# Patient Record
Sex: Female | Born: 1945 | Race: White | Hispanic: No | Marital: Married | Smoking: Former smoker
Health system: Southern US, Community
[De-identification: ages and names within clinical notes are randomized; demographics above are authoritative.]

## PROBLEM LIST (undated history)

## (undated) DIAGNOSIS — M779 Enthesopathy, unspecified: Secondary | ICD-10-CM

## (undated) DIAGNOSIS — R609 Edema, unspecified: Secondary | ICD-10-CM

## (undated) DIAGNOSIS — C439 Malignant melanoma of skin, unspecified: Secondary | ICD-10-CM

## (undated) DIAGNOSIS — R55 Syncope and collapse: Secondary | ICD-10-CM

## (undated) DIAGNOSIS — E119 Type 2 diabetes mellitus without complications: Secondary | ICD-10-CM

## (undated) DIAGNOSIS — D3610 Benign neoplasm of peripheral nerves and autonomic nervous system, unspecified: Secondary | ICD-10-CM

## (undated) HISTORY — DX: Enthesopathy, unspecified: M77.9

## (undated) HISTORY — DX: Benign neoplasm of peripheral nerves and autonomic nervous system, unspecified: D36.10

## (undated) HISTORY — DX: Type 2 diabetes mellitus without complications: E11.9

## (undated) HISTORY — DX: Malignant melanoma of skin, unspecified: C43.9

## (undated) HISTORY — DX: Edema, unspecified: R60.9

## (undated) HISTORY — DX: Syncope and collapse: R55

---
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
## 1970-05-14 HISTORY — PX: CHOLECYSTECTOMY: SHX55

## 1978-05-14 HISTORY — PX: BREAST SURGERY: SHX581

## 1989-05-14 HISTORY — PX: ABDOMINAL HYSTERECTOMY: SHX81

## 1990-05-14 DIAGNOSIS — R55 Syncope and collapse: Secondary | ICD-10-CM

## 1990-05-14 HISTORY — DX: Syncope and collapse: R55

## 1995-05-15 HISTORY — PX: EXCISION/RELEASE BURSA HIP: SHX5014

## 1998-05-14 DIAGNOSIS — R609 Edema, unspecified: Secondary | ICD-10-CM

## 1998-05-14 DIAGNOSIS — M779 Enthesopathy, unspecified: Secondary | ICD-10-CM

## 1998-05-14 HISTORY — DX: Edema, unspecified: R60.9

## 1998-05-14 HISTORY — DX: Enthesopathy, unspecified: M77.9

## 2003-05-15 DIAGNOSIS — E119 Type 2 diabetes mellitus without complications: Secondary | ICD-10-CM

## 2003-05-15 HISTORY — DX: Type 2 diabetes mellitus without complications: E11.9

## 2003-05-15 HISTORY — PX: LIVER BIOPSY: SHX301

## 2004-05-14 DIAGNOSIS — D3610 Benign neoplasm of peripheral nerves and autonomic nervous system, unspecified: Secondary | ICD-10-CM

## 2004-05-14 HISTORY — DX: Benign neoplasm of peripheral nerves and autonomic nervous system, unspecified: D36.10

## 2004-09-14 ENCOUNTER — Ambulatory Visit: Payer: Self-pay

## 2005-04-23 ENCOUNTER — Ambulatory Visit: Payer: Self-pay | Admitting: Internal Medicine

## 2006-04-11 ENCOUNTER — Ambulatory Visit: Payer: Self-pay | Admitting: Otolaryngology

## 2006-05-14 DIAGNOSIS — C439 Malignant melanoma of skin, unspecified: Secondary | ICD-10-CM

## 2006-05-14 HISTORY — DX: Malignant melanoma of skin, unspecified: C43.9

## 2006-05-14 HISTORY — PX: MELANOMA EXCISION: SHX5266

## 2007-02-25 ENCOUNTER — Ambulatory Visit: Payer: Self-pay | Admitting: Dermatology

## 2007-04-24 ENCOUNTER — Encounter: Payer: Self-pay | Admitting: Specialist

## 2009-11-02 ENCOUNTER — Ambulatory Visit: Payer: Self-pay | Admitting: Anesthesiology

## 2009-11-15 ENCOUNTER — Ambulatory Visit: Payer: Self-pay | Admitting: Anesthesiology

## 2009-12-21 ENCOUNTER — Ambulatory Visit: Payer: Self-pay | Admitting: Anesthesiology

## 2010-01-19 ENCOUNTER — Ambulatory Visit: Payer: Self-pay | Admitting: Anesthesiology

## 2010-09-15 ENCOUNTER — Emergency Department: Payer: Self-pay | Admitting: Emergency Medicine

## 2010-12-22 ENCOUNTER — Ambulatory Visit: Payer: Self-pay | Admitting: Unknown Physician Specialty

## 2011-01-30 ENCOUNTER — Ambulatory Visit: Payer: Self-pay | Admitting: Unknown Physician Specialty

## 2011-01-31 ENCOUNTER — Ambulatory Visit: Payer: Self-pay | Admitting: Unknown Physician Specialty

## 2011-05-15 HISTORY — PX: KNEE SURGERY: SHX244

## 2017-12-26 ENCOUNTER — Other Ambulatory Visit: Payer: Self-pay | Admitting: Unknown Physician Specialty

## 2017-12-26 DIAGNOSIS — G8929 Other chronic pain: Secondary | ICD-10-CM

## 2017-12-26 DIAGNOSIS — M25561 Pain in right knee: Principal | ICD-10-CM

## 2018-01-08 ENCOUNTER — Ambulatory Visit
Admission: RE | Admit: 2018-01-08 | Discharge: 2018-01-08 | Disposition: A | Discharge status: Home / Self Care | Payer: Federal, State, Local not specified - PPO | Source: Ambulatory Visit | Attending: Unknown Physician Specialty | Admitting: Unknown Physician Specialty

## 2018-01-08 DIAGNOSIS — G8929 Other chronic pain: Secondary | ICD-10-CM | POA: Diagnosis present

## 2018-01-08 DIAGNOSIS — M1711 Unilateral primary osteoarthritis, right knee: Secondary | ICD-10-CM | POA: Diagnosis not present

## 2018-01-08 DIAGNOSIS — M25561 Pain in right knee: Secondary | ICD-10-CM

## 2018-01-08 DIAGNOSIS — S83241A Other tear of medial meniscus, current injury, right knee, initial encounter: Secondary | ICD-10-CM | POA: Diagnosis not present

## 2018-01-16 DIAGNOSIS — M1711 Unilateral primary osteoarthritis, right knee: Secondary | ICD-10-CM | POA: Insufficient documentation

## 2018-04-03 ENCOUNTER — Encounter: Payer: Self-pay | Admitting: Student in an Organized Health Care Education/Training Program

## 2018-04-03 ENCOUNTER — Ambulatory Visit
Payer: Federal, State, Local not specified - PPO | Attending: Student in an Organized Health Care Education/Training Program | Admitting: Student in an Organized Health Care Education/Training Program

## 2018-04-03 ENCOUNTER — Other Ambulatory Visit: Payer: Self-pay

## 2018-04-03 VITALS — BP 153/71 | HR 92 | Temp 98.7°F | Resp 18 | Ht 63.75 in | Wt 208.0 lb

## 2018-04-03 DIAGNOSIS — Z85820 Personal history of malignant melanoma of skin: Secondary | ICD-10-CM | POA: Diagnosis not present

## 2018-04-03 DIAGNOSIS — M25561 Pain in right knee: Secondary | ICD-10-CM | POA: Diagnosis not present

## 2018-04-03 DIAGNOSIS — E119 Type 2 diabetes mellitus without complications: Secondary | ICD-10-CM | POA: Diagnosis not present

## 2018-04-03 DIAGNOSIS — G8929 Other chronic pain: Secondary | ICD-10-CM | POA: Diagnosis present

## 2018-04-03 DIAGNOSIS — Z87891 Personal history of nicotine dependence: Secondary | ICD-10-CM | POA: Diagnosis not present

## 2018-04-03 DIAGNOSIS — Z79899 Other long term (current) drug therapy: Secondary | ICD-10-CM | POA: Insufficient documentation

## 2018-04-03 DIAGNOSIS — Z7982 Long term (current) use of aspirin: Secondary | ICD-10-CM | POA: Diagnosis not present

## 2018-04-03 DIAGNOSIS — M1711 Unilateral primary osteoarthritis, right knee: Secondary | ICD-10-CM | POA: Diagnosis not present

## 2018-04-03 DIAGNOSIS — E669 Obesity, unspecified: Secondary | ICD-10-CM | POA: Insufficient documentation

## 2018-04-03 DIAGNOSIS — Z6835 Body mass index (BMI) 35.0-35.9, adult: Secondary | ICD-10-CM | POA: Insufficient documentation

## 2018-04-03 DIAGNOSIS — Z7984 Long term (current) use of oral hypoglycemic drugs: Secondary | ICD-10-CM | POA: Insufficient documentation

## 2018-04-03 DIAGNOSIS — G894 Chronic pain syndrome: Secondary | ICD-10-CM | POA: Insufficient documentation

## 2018-04-03 DIAGNOSIS — M25562 Pain in left knee: Secondary | ICD-10-CM

## 2018-04-03 MED ORDER — DICLOFENAC SODIUM 75 MG PO TBEC: 75.0000 mg | DELAYED_RELEASE_TABLET | Freq: Every day | ORAL | 1 refills | Status: DC

## 2018-04-03 NOTE — Patient Instructions (Addendum)
Stop Aleve while on Diclofenac  Diclofenac has been escribed to your pharmacy.    GENERAL RISKS AND COMPLICATIONS  What are the risk, side effects and possible complications? Generally speaking, most procedures are safe.  However, with any procedure there are risks, side effects, and the possibility of complications.  The risks and complications are dependent upon the sites that are lesioned, or the type of nerve block to be performed.  The closer the procedure is to the spine, the more serious the risks are.  Great care is taken when placing the radio frequency needles, block needles or lesioning probes, but sometimes complications can occur. 1. Infection: Any time there is an injection through the skin, there is a risk of infection.  This is why sterile conditions are used for these blocks.  There are four possible types of infection. 1. Localized skin infection. 2. Central Nervous System Infection-This can be in the form of Meningitis, which can be deadly. 3. Epidural Infections-This can be in the form of an epidural abscess, which can cause pressure inside of the spine, causing compression of the spinal cord with subsequent paralysis. This would require an emergency surgery to decompress, and there are no guarantees that the patient would recover from the paralysis. 4. Discitis-This is an infection of the intervertebral discs.  It occurs in about 1% of discography procedures.  It is difficult to treat and it may lead to surgery.        2. Pain: the needles have to go through skin and soft tissues, will cause soreness.       3. Damage to internal structures:  The nerves to be lesioned may be near blood vessels or    other nerves which can be potentially damaged.       4. Bleeding: Bleeding is more common if the patient is taking blood thinners such as  aspirin, Coumadin, Ticiid, Plavix, etc., or if he/she have some genetic predisposition  such as hemophilia. Bleeding into the spinal canal can  cause compression of the spinal  cord with subsequent paralysis.  This would require an emergency surgery to  decompress and there are no guarantees that the patient would recover from the  paralysis.       5. Pneumothorax:  Puncturing of a lung is a possibility, every time a needle is introduced in  the area of the chest or upper back.  Pneumothorax refers to free air around the  collapsed lung(s), inside of the thoracic cavity (chest cavity).  Another two possible  complications related to a similar event would include: Hemothorax and Chylothorax.   These are variations of the Pneumothorax, where instead of air around the collapsed  lung(s), you may have blood or chyle, respectively.       6. Spinal headaches: They may occur with any procedures in the area of the spine.       7. Persistent CSF (Cerebro-Spinal Fluid) leakage: This is a rare problem, but may occur  with prolonged intrathecal or epidural catheters either due to the formation of a fistulous  track or a dural tear.       8. Nerve damage: By working so close to the spinal cord, there is always a possibility of  nerve damage, which could be as serious as a permanent spinal cord injury with  paralysis.       9. Death:  Although rare, severe deadly allergic reactions known as "Anaphylactic  reaction" can occur to any of the medications used.  10. Worsening of the symptoms:  We can always make thing worse.  What are the chances of something like this happening? Chances of any of this occuring are extremely low.  By statistics, you have more of a chance of getting killed in a motor vehicle accident: while driving to the hospital than any of the above occurring .  Nevertheless, you should be aware that they are possibilities.  In general, it is similar to taking a shower.  Everybody knows that you can slip, hit your head and get killed.  Does that mean that you should not shower again?  Nevertheless always keep in mind that statistics do not mean  anything if you happen to be on the wrong side of them.  Even if a procedure has a 1 (one) in a 1,000,000 (million) chance of going wrong, it you happen to be that one..Also, keep in mind that by statistics, you have more of a chance of having something go wrong when taking medications.  Who should not have this procedure? If you are on a blood thinning medication (e.g. Coumadin, Plavix, see list of "Blood Thinners"), or if you have an active infection going on, you should not have the procedure.  If you are taking any blood thinners, please inform your physician.  How should I prepare for this procedure?  Do not eat or drink anything at least six hours prior to the procedure.  Bring a driver with you .  It cannot be a taxi.  Come accompanied by an adult that can drive you back, and that is strong enough to help you if your legs get weak or numb from the local anesthetic.  Take all of your medicines the morning of the procedure with just enough water to swallow them.  If you have diabetes, make sure that you are scheduled to have your procedure done first thing in the morning, whenever possible.  If you have diabetes, take only half of your insulin dose and notify our nurse that you have done so as soon as you arrive at the clinic.  If you are diabetic, but only take blood sugar pills (oral hypoglycemic), then do not take them on the morning of your procedure.  You may take them after you have had the procedure.  Do not take aspirin or any aspirin-containing medications, at least eleven (11) days prior to the procedure.  They may prolong bleeding.  Wear loose fitting clothing that may be easy to take off and that you would not mind if it got stained with Betadine or blood.  Do not wear any jewelry or perfume  Remove any nail coloring.  It will interfere with some of our monitoring equipment.  NOTE: Remember that this is not meant to be interpreted as a complete list of all possible  complications.  Unforeseen problems may occur.  BLOOD THINNERS The following drugs contain aspirin or other products, which can cause increased bleeding during surgery and should not be taken for 2 weeks prior to and 1 week after surgery.  If you should need take something for relief of minor pain, you may take acetaminophen which is found in Tylenol,m Datril, Anacin-3 and Panadol. It is not blood thinner. The products listed below are.  Do not take any of the products listed below in addition to any listed on your instruction sheet.  A.P.C or A.P.C with Codeine Codeine Phosphate Capsules #3 Ibuprofen Ridaura  ABC compound Congesprin Imuran rimadil  Advil Cope Indocin Robaxisal  Alka-Seltzer Effervescent Pain Reliever and Antacid Coricidin or Coricidin-D  Indomethacin Rufen  Alka-Seltzer plus Cold Medicine Cosprin Ketoprofen S-A-C Tablets  Anacin Analgesic Tablets or Capsules Coumadin Korlgesic Salflex  Anacin Extra Strength Analgesic tablets or capsules CP-2 Tablets Lanoril Salicylate  Anaprox Cuprimine Capsules Levenox Salocol  Anexsia-D Dalteparin Magan Salsalate  Anodynos Darvon compound Magnesium Salicylate Sine-off  Ansaid Dasin Capsules Magsal Sodium Salicylate  Anturane Depen Capsules Marnal Soma  APF Arthritis pain formula Dewitt's Pills Measurin Stanback  Argesic Dia-Gesic Meclofenamic Sulfinpyrazone  Arthritis Bayer Timed Release Aspirin Diclofenac Meclomen Sulindac  Arthritis pain formula Anacin Dicumarol Medipren Supac  Analgesic (Safety coated) Arthralgen Diffunasal Mefanamic Suprofen  Arthritis Strength Bufferin Dihydrocodeine Mepro Compound Suprol  Arthropan liquid Dopirydamole Methcarbomol with Aspirin Synalgos  ASA tablets/Enseals Disalcid Micrainin Tagament  Ascriptin Doan's Midol Talwin  Ascriptin A/D Dolene Mobidin Tanderil  Ascriptin Extra Strength Dolobid Moblgesic Ticlid  Ascriptin with Codeine Doloprin or Doloprin with Codeine Momentum Tolectin  Asperbuf Duoprin  Mono-gesic Trendar  Aspergum Duradyne Motrin or Motrin IB Triminicin  Aspirin plain, buffered or enteric coated Durasal Myochrisine Trigesic  Aspirin Suppositories Easprin Nalfon Trillsate  Aspirin with Codeine Ecotrin Regular or Extra Strength Naprosyn Uracel  Atromid-S Efficin Naproxen Ursinus  Auranofin Capsules Elmiron Neocylate Vanquish  Axotal Emagrin Norgesic Verin  Azathioprine Empirin or Empirin with Codeine Normiflo Vitamin E  Azolid Emprazil Nuprin Voltaren  Bayer Aspirin plain, buffered or children's or timed BC Tablets or powders Encaprin Orgaran Warfarin Sodium  Buff-a-Comp Enoxaparin Orudis Zorpin  Buff-a-Comp with Codeine Equegesic Os-Cal-Gesic   Buffaprin Excedrin plain, buffered or Extra Strength Oxalid   Bufferin Arthritis Strength Feldene Oxphenbutazone   Bufferin plain or Extra Strength Feldene Capsules Oxycodone with Aspirin   Bufferin with Codeine Fenoprofen Fenoprofen Pabalate or Pabalate-SF   Buffets II Flogesic Panagesic   Buffinol plain or Extra Strength Florinal or Florinal with Codeine Panwarfarin   Buf-Tabs Flurbiprofen Penicillamine   Butalbital Compound Four-way cold tablets Penicillin   Butazolidin Fragmin Pepto-Bismol   Carbenicillin Geminisyn Percodan   Carna Arthritis Reliever Geopen Persantine   Carprofen Gold's salt Persistin   Chloramphenicol Goody's Phenylbutazone   Chloromycetin Haltrain Piroxlcam   Clmetidine heparin Plaquenil   Cllnoril Hyco-pap Ponstel   Clofibrate Hydroxy chloroquine Propoxyphen         Before stopping any of these medications, be sure to consult the physician who ordered them.  Some, such as Coumadin (Warfarin) are ordered to prevent or treat serious conditions such as "deep thrombosis", "pumonary embolisms", and other heart problems.  The amount of time that you may need off of the medication may also vary with the medication and the reason for which you were taking it.  If you are taking any of these medications, please  make sure you notify your pain physician before you undergo any procedures.

## 2018-04-03 NOTE — Progress Notes (Signed)
Patient's Name: Meredith Caldwell  MRN: [id number]  Referring Provider: Leanor Kail, MD  DOB: 1945/05/19  PCP: Baxter Hire, MD  DOS: 04/03/2018  Note by: Gillis Santa, MD  Service setting: Ambulatory outpatient  Specialty: Interventional Pain Management  Location: [facility] (AMB) Pain Management Facility  Visit type: Initial Patient Evaluation  Patient type: New Patient   Primary Reason(s) for Visit: Encounter for initial evaluation of one or more chronic problems (new to examiner) potentially causing chronic pain, and posing a threat to normal musculoskeletal function. (Level of risk: High) CC: Knee Pain (bilaterally, right is worse)  HPI  Meredith Caldwell is a 72 y.o. year old, female patient, who comes today to see [place] for the first time for an initial evaluation of her chronic pain. She has Primary osteoarthritis of right knee; Obesity; Diabetes mellitus type 2, uncomplicated ([place]); Chronic pain of right knee; and Chronic pain syndrome on their problem list. Today she comes in for evaluation of her Knee Pain (bilaterally, right is worse)  Pain Assessment: Location: Right, Left(right is worse) Knee Radiating: behind knees and down sides of leg to calves; occasionally right big toe feels like a "charlie horse" Onset: More than a month ago Duration: Chronic pain Quality: Constant, Sharp Severity: 8 /10 (subjective, self-reported pain score)  Note: Reported level is inconsistent with clinical observations.                         When using our objective Pain Scale, levels between 6 and 10/10 are said to belong in an emergency room, as it progressively worsens from a 6/10, described as severely limiting, requiring emergency care not usually available at an outpatient pain management facility. At a 6/10 level, communication becomes difficult and requires great effort. Assistance to reach the emergency department may be required. Facial flushing and profuse sweating along with potentially dangerous  increases in heart rate and blood pressure will be evident. Effect on ADL: cannot kneel or squat, cannot carry laundry basket Timing: Constant Modifying factors: injection BP: (!) 153/71(PUT CUFF ON LEFT ARM)  HR: 92  Onset and Duration: Sudden, Date of onset: July 2019 and Date of injury: fall Cause of pain: fall Severity: NAS-11 at its worse: 9/10, NAS-11 at its best: 5/10, NAS-11 now: 6/10 and NAS-11 on the average: 6/10 Timing: Night and After activity or exercise Aggravating Factors: Bending, Climbing, Kneeling, Lifiting, Motion, Prolonged sitting, Prolonged standing, Squatting, Stooping , Twisting, Walking, Walking uphill and Walking downhill Alleviating Factors: Hot packs, Resting, Sitting and Warm showers or baths Associated Problems: Weakness and Pain that wakes patient up Quality of Pain: Disabling, Sharp, Stabbing and Tender Previous Examinations or Tests: MRI scan Previous Treatments: The patient denies shot in the back  The patient comes into the clinics today for the first time for a chronic pain management evaluation.   72 year old female with a history of right knee osteoarthritis, tricompartmental, status post right knee arthroscopy in 2012 for a torn medial meniscus as well as significant chondral degeneration.  She is status post right knee Visco supplementation which was not very effective.  Patient is also tried right knee corticosteroid injections which only lasted a short period of time.  Patient presents as a referral from Dr. Jefm Bryant for evaluation of right knee genicular nerve block and possible radiofrequency ablation.   Meds   Current Outpatient Medications:  .  albuterol (PROVENTIL HFA;VENTOLIN HFA) 108 (90 Base) MCG/ACT inhaler, Inhale into the lungs., Disp: , Rfl:  .  aspirin EC 81 MG tablet, Take by mouth., Disp: , Rfl:  .  cetirizine (ZYRTEC) 10 MG tablet, Take 10 mg by mouth daily., Disp: , Rfl:  .  colesevelam (WELCHOL) 625 MG tablet, Take by mouth.,  Disp: , Rfl:  .  FISH OIL-KRILL OIL PO, Take 360 mg by mouth., Disp: , Rfl:  .  furosemide (LASIX) 40 MG tablet, Take by mouth., Disp: , Rfl:  .  losartan (COZAAR) 50 MG tablet, take 1 tablet by mouth once daily, Disp: , Rfl:  .  metFORMIN (GLUCOPHAGE) 1000 MG tablet, Take 1,000 mg by mouth 2 (two) times daily with a meal., Disp: , Rfl:  .  montelukast (SINGULAIR) 10 MG tablet, Take 10 mg by mouth at bedtime., Disp: , Rfl:  .  Multiple Vitamin (MULTIVITAMIN WITH MINERALS) TABS tablet, Take 1 tablet by mouth daily., Disp: , Rfl:  .  nabumetone (RELAFEN) 500 MG tablet, Take 500 mg by mouth daily., Disp: , Rfl:  .  naproxen sodium (ALEVE) 220 MG tablet, Take 220 mg by mouth., Disp: , Rfl:  .  PARoxetine (PAXIL-CR) 25 MG 24 hr tablet, Take 25 mg by mouth daily., Disp: , Rfl:  .  penciclovir (DENAVIR) 1 % cream, Apply 1 application topically every 2 (two) hours., Disp: , Rfl:  .  zolpidem (AMBIEN) 5 MG tablet, Take 5 mg by mouth at bedtime as needed for sleep., Disp: , Rfl:  .  diclofenac (VOLTAREN) 75 MG EC tablet, Take 1 tablet (75 mg total) by mouth daily for 60 doses. (Patient not taking: Reported on 04/03/2018), Disp: 30 tablet, Rfl: 1   Knee-R MR wo contrast:  Results for orders placed during the hospital encounter of 01/08/18  MR KNEE RIGHT WO CONTRAST   Narrative CLINICAL DATA:  Diffuse right knee pain. History of fall in July, 2019. The patient underwent right knee surgery in 2013.  EXAM: MRI OF THE RIGHT KNEE WITHOUT CONTRAST  TECHNIQUE: Multiplanar, multisequence MR imaging of the knee was performed. No intravenous contrast was administered.  COMPARISON:  MRI right knee 12/22/2010.  FINDINGS: MENISCI  Medial meniscus: Radial tear at the root of the posterior horn of the medial meniscus is again seen. The remainder of the posterior horn is diminutive with blunting along its free edge likely related to prior surgery. The body is severely degenerated and extruded peripherally  with a horizontal tear reaching the meniscal undersurface.  Lateral meniscus:  Intact.  LIGAMENTS  Cruciates:  Intact.  Collaterals:  Intact.  CARTILAGE  Patellofemoral: Diffuse, severe cartilage loss is again seen and appears somewhat worse than on the prior exam.  Medial:  Diffuse and severe cartilage loss is again seen.  Lateral:  No marked change in thinning and irregularity throughout.  Joint:  Small effusion.  Popliteal Fossa:  No Baker's cyst.  Extensor Mechanism:  Intact.  Bones: Tricompartmental osteophytosis has worsened since the prior examination, particularly on the medial side. No fracture or worrisome lesion.  Other: None.  IMPRESSION: Dominant finding is advanced osteoarthritis about the knee appearing worst in the medial and patellofemoral compartments.  Radial tear at the root of the posterior of the medial meniscus is unchanged in appearance since the prior exam. The remainder of the posterior horn appears to have been debrided. Severely degenerated and diminutive body of the medial meniscus is extruded peripherally.   Electronically Signed   By: Inge Rise M.D.   On: 01/09/2018 11:29      Complexity Note: Imaging results reviewed. Results shared  with Ms. Vary, using Layman's terms.                         ROS  Cardiovascular: Daily Aspirin intake and High blood pressure Pulmonary or Respiratory: Wheezing and difficulty taking a deep full breath (Asthma) Neurological: No reported neurological signs or symptoms such as seizures, abnormal skin sensations, urinary and/or fecal incontinence, being born with an abnormal open spine and/or a tethered spinal cord Review of Past Neurological Studies: No results found for this or any previous visit. Psychological-Psychiatric: Anxiousness and Difficulty sleeping and or falling asleep Gastrointestinal: No reported gastrointestinal signs or symptoms such as vomiting or evacuating blood,  reflux, heartburn, alternating episodes of diarrhea and constipation, inflamed or scarred liver, or pancreas or irrregular and/or infrequent bowel movements Genitourinary: No reported renal or genitourinary signs or symptoms such as difficulty voiding or producing urine, peeing blood, non-functioning kidney, kidney stones, difficulty emptying the bladder, difficulty controlling the flow of urine, or chronic kidney disease Hematological: No reported hematological signs or symptoms such as prolonged bleeding, low or poor functioning platelets, bruising or bleeding easily, hereditary bleeding problems, low energy levels due to low hemoglobin or being anemic Endocrine: High blood sugar controlled without the use of insulin (NIDDM) Rheumatologic: No reported rheumatological signs and symptoms such as fatigue, joint pain, tenderness, swelling, redness, heat, stiffness, decreased range of motion, with or without associated rash Musculoskeletal: Negative for myasthenia gravis, muscular dystrophy, multiple sclerosis or malignant hyperthermia Work History: Retired  Allergies  Ms. Blane is allergic to statins; theophyllines; and zetia [ezetimibe].  Laboratory Chemistry  Inflammation Markers (CRP: Acute Phase) (ESR: Chronic Phase) No results found for: CRP, ESRSEDRATE, LATICACIDVEN                       Rheumatology Markers No results found for: RF, ANA, LABURIC, URICUR, LYMEIGGIGMAB, LYMEABIGMQN, HLAB27                      Renal Function Markers No results found for: BUN, CREATININE, BCR, GFRAA, GFRNONAA                           Hepatic Function Markers No results found for: AST, ALT, ALBUMIN, ALKPHOS, HCVAB, AMYLASE, LIPASE, AMMONIA                      Electrolytes No results found for: NA, K, CL, CALCIUM, MG, PHOS                      Neuropathy Markers No results found for: VITAMINB12, FOLATE, HGBA1C, HIV                      CNS Tests No results found for: COLORCSF, APPEARCSF,  RBCCOUNTCSF, WBCCSF, POLYSCSF, LYMPHSCSF, EOSCSF, PROTEINCSF, GLUCCSF, JCVIRUS, CSFOLI, IGGCSF                      Bone Pathology Markers No results found for: VD25OH, [id number], HE5277OE4, MP5361WE3, 25OHVITD1, 25OHVITD2, 25OHVITD3, TESTOFREE, TESTOSTERONE                       Coagulation Parameters No results found for: INR, LABPROT, APTT, PLT, DDIMER, LABHEMA                      Cardiovascular Markers No results found  for: BNP, CKTOTAL, CKMB, TROPONINI, HGB, HCT                       CA Markers No results found for: CEA, CA125, LABCA2                      Note: Lab results reviewed.  [place]  Drug: Ms. Dor  reports that she does not use drugs. Alcohol:  reports that she drinks alcohol. Tobacco:  reports that she quit smoking about 49 years ago. She has never used smokeless tobacco. Medical:  has a past medical history of Bone spur (2000), Diabetes ([place]) (2005), Edema (2000), Melanoma ([place]) (2008), Neuroma (2006), and Vaso vagal episode (1992). Family: family history is not on file.  Past Surgical History:  Procedure Laterality Date  . ABDOMINAL HYSTERECTOMY  1991  . APPENDECTOMY  1960  . BREAST SURGERY  1980   right chest, left brest  . CHOLECYSTECTOMY  1972  . EXCISION/RELEASE BURSA HIP Left 1997  . KNEE SURGERY Right 2013  . LIVER BIOPSY  2005  . MELANOMA EXCISION Left 2008   arm; cancerous  . TONSILLECTOMY AND ADENOIDECTOMY  1950   Active Ambulatory Problems    Diagnosis Date Noted  . Primary osteoarthritis of right knee 01/16/2018  . Obesity 04/03/2018  . Diabetes mellitus type 2, uncomplicated ([place]) 24/23/5361  . Chronic pain of right knee 04/03/2018  . Chronic pain syndrome 04/03/2018   Resolved Ambulatory Problems    Diagnosis Date Noted  . No Resolved Ambulatory Problems   Past Medical History:  Diagnosis Date  . Bone spur 2000  . Diabetes ([place]) 2005  . Edema 2000  . Melanoma ([place]) 2008  . Neuroma 2006  . Vaso vagal episode 1992    Constitutional Exam  General appearance: alert, cooperative and moderately obese Vitals:   04/03/18 1053  BP: (!) 153/71  Pulse: 92  Resp: 18  Temp: 98.7 F (37.1 C)  TempSrc: Oral  SpO2: 95%  Weight: 208 lb (94.3 kg)  Height: 5' 3.75" (1.619 m)   BMI Assessment: Estimated body mass index is 35.98 kg/m as calculated from the following:   Height as of this encounter: 5' 3.75" (1.619 m).   Weight as of this encounter: 208 lb (94.3 kg).  BMI interpretation table: BMI level Category Range association with higher incidence of chronic pain  <18 kg/m2 Underweight   18.5-24.9 kg/m2 Ideal body weight   25-29.9 kg/m2 Overweight Increased incidence by 20%  30-34.9 kg/m2 Obese (Class I) Increased incidence by 68%  35-39.9 kg/m2 Severe obesity (Class II) Increased incidence by 136%  >40 kg/m2 Extreme obesity (Class III) Increased incidence by 254%   Patient's current BMI Ideal Body weight  Body mass index is 35.98 kg/m. Ideal body weight: 54.1 kg (119 lb 5.2 oz) Adjusted ideal body weight: 70.2 kg (154 lb 12.7 oz)   BMI Readings from Last 4 Encounters:  04/03/18 35.98 kg/m   Wt Readings from Last 4 Encounters:  04/03/18 208 lb (94.3 kg)  Psych/Mental status: Alert, oriented x 3 (person, place, & time)       Eyes: PERLA Respiratory: No evidence of acute respiratory distress  Cervical Spine Area Exam  Skin & Axial Inspection: No masses, redness, edema, swelling, or associated skin lesions Alignment: Symmetrical Functional ROM: Unrestricted ROM      Stability: No instability detected Muscle Tone/Strength: Functionally intact. No obvious neuro-muscular anomalies detected. Sensory (Neurological): Unimpaired Palpation: No palpable anomalies  Upper Extremity (UE) Exam    Side: Right upper extremity  Side: Left upper extremity  Skin & Extremity Inspection: Skin color, temperature, and hair growth are WNL. No peripheral edema or cyanosis. No masses, redness, swelling,  asymmetry, or associated skin lesions. No contractures.  Skin & Extremity Inspection: Skin color, temperature, and hair growth are WNL. No peripheral edema or cyanosis. No masses, redness, swelling, asymmetry, or associated skin lesions. No contractures.  Functional ROM: Unrestricted ROM          Functional ROM: Unrestricted ROM          Muscle Tone/Strength: Functionally intact. No obvious neuro-muscular anomalies detected.  Muscle Tone/Strength: Functionally intact. No obvious neuro-muscular anomalies detected.  Sensory (Neurological): Unimpaired          Sensory (Neurological): Unimpaired          Palpation: No palpable anomalies              Palpation: No palpable anomalies              Provocative Test(s):  Phalen's test: deferred Tinel's test: deferred Apley's scratch test (touch opposite shoulder):  Action 1 (Across chest): deferred Action 2 (Overhead): deferred Action 3 (LB reach): deferred   Provocative Test(s):  Phalen's test: deferred Tinel's test: deferred Apley's scratch test (touch opposite shoulder):  Action 1 (Across chest): deferred Action 2 (Overhead): deferred Action 3 (LB reach): deferred    Thoracic Spine Area Exam  Skin & Axial Inspection: No masses, redness, or swelling Alignment: Symmetrical Functional ROM: Unrestricted ROM Stability: No instability detected Muscle Tone/Strength: Functionally intact. No obvious neuro-muscular anomalies detected. Sensory (Neurological): Unimpaired Muscle strength & Tone: No palpable anomalies  Lumbar Spine Area Exam  Skin & Axial Inspection: No masses, redness, or swelling Alignment: Symmetrical Functional ROM: Unrestricted ROM       Stability: No instability detected Muscle Tone/Strength: Functionally intact. No obvious neuro-muscular anomalies detected. Sensory (Neurological): Unimpaired Palpation: No palpable anomalies       Provocative Tests: Hyperextension/rotation test: deferred today       Lumbar quadrant test  (Kemp's test): deferred today       Lateral bending test: deferred today       Patrick's Maneuver: deferred today                   FABER test: deferred today                   S-I anterior distraction/compression test: deferred today         S-I lateral compression test: deferred today         S-I Thigh-thrust test: deferred today         S-I Gaenslen's test: deferred today          Gait & Posture Assessment  Ambulation: Unassisted Gait: Relatively normal for age and body habitus Posture: WNL   Lower Extremity Exam    Side: Right lower extremity  Side: Left lower extremity  Stability: No instability observed          Stability: No instability observed          Skin & Extremity Inspection: Evidence of prior arthroplastic surgery  Skin & Extremity Inspection: Skin color, temperature, and hair growth are WNL. No peripheral edema or cyanosis. No masses, redness, swelling, asymmetry, or associated skin lesions. No contractures.  Functional ROM: Decreased ROM for hip and knee joints          Functional ROM: Unrestricted ROM  Muscle Tone/Strength: Functionally intact. No obvious neuro-muscular anomalies detected.  Muscle Tone/Strength: Functionally intact. No obvious neuro-muscular anomalies detected.  Sensory (Neurological): Arthropathic arthralgia        Sensory (Neurological): Unimpaired        DTR: Patellar: deferred today Achilles: deferred today Plantar: deferred today  DTR: Patellar: deferred today Achilles: deferred today Plantar: deferred today  Palpation: Complains of area being tender to palpation  Palpation: No palpable anomalies   Assessment  Primary Diagnosis & Pertinent Problem List: The primary encounter diagnosis was Primary osteoarthritis of right knee. Diagnoses of Chronic pain of right knee, Obesity, unspecified classification, unspecified obesity type, unspecified whether serious comorbidity present, and Chronic pain syndrome were also pertinent to  this visit.  Visit Diagnosis (New problems to examiner): 1. Primary osteoarthritis of right knee   2. Chronic pain of right knee   3. Obesity, unspecified classification, unspecified obesity type, unspecified whether serious comorbidity present   4. Chronic pain syndrome    72 year old female with a history of right knee osteoarthritis, tricompartmental, status post right knee arthroscopy in 2012 for a torn medial meniscus as well as significant chondral degeneration.  She is status post right knee Visco supplementation which was not very effective.  Patient is also tried right knee corticosteroid injections which only lasted a short period of time.  Patient has also tried PT, heat, creams, NSAIDs without any significant benefit. Patient presents as a referral from Dr. Jefm Bryant for evaluation of right knee genicular nerve block and possible radiofrequency ablation.  Risks and benefits of right knee genicular nerve block were discussed and patient would like to proceed.  Patient has not tried diclofenac.  She is currently on Aleve.  We discussed a trial of diclofenac since it has better absorption and synovial fluid of the knee.  I will have patient discontinue Aleve and try diclofenac 75 mg daily after meal for 30 days.  Plan of Care (Initial workup plan)   Right knee genicular nerve block without sedation Orders Placed This Encounter  Procedures  . GENICULAR NERVE BLOCK    Indication(s):  Sub-acute knee pain    Standing Status:   Future    Standing Expiration Date:   05/03/2018    Scheduling Instructions:     Side: Right-sided     Sedation: No Sedation.     Timeframe: ASAP    Order Specific Question:   Where will this procedure be performed?    Answer:   [facility]   Requested Prescriptions   Signed Prescriptions Disp Refills  . diclofenac (VOLTAREN) 75 MG EC tablet 30 tablet 1    Sig: Take 1 tablet (75 mg total) by mouth daily for 60 doses.    Patient not taking:  Reported on 04/03/2018     Provider-requested follow-up: Return for Procedure.  Future Appointments  Date Time Provider [place]  04/23/2018 12:30 PM Gillis Santa, MD [facility] None    Primary Care Physician: Baxter Hire, MD Location: [facility] Note by: Gillis Santa, M.D, Date: 04/03/2018; Time: 1:54 PM  Patient Instructions   Stop Aleve while on Diclofenac  Diclofenac has been escribed to your pharmacy.    GENERAL RISKS AND COMPLICATIONS  What are the risk, side effects and possible complications? Generally speaking, most procedures are safe.  However, with any procedure there are risks, side effects, and the possibility of complications.  The risks and complications are dependent upon the sites that are lesioned, or the type of nerve  block to be performed.  The closer the procedure is to the spine, the more serious the risks are.  Great care is taken when placing the radio frequency needles, block needles or lesioning probes, but sometimes complications can occur. 1. Infection: Any time there is an injection through the skin, there is a risk of infection.  This is why sterile conditions are used for these blocks.  There are four possible types of infection. 1. Localized skin infection. 2. Central Nervous System Infection-This can be in the form of Meningitis, which can be deadly. 3. Epidural Infections-This can be in the form of an epidural abscess, which can cause pressure inside of the spine, causing compression of the spinal cord with subsequent paralysis. This would require an emergency surgery to decompress, and there are no guarantees that the patient would recover from the paralysis. 4. Discitis-This is an infection of the intervertebral discs.  It occurs in about 1% of discography procedures.  It is difficult to treat and it may lead to surgery.        2. Pain: the needles have to go through skin and soft tissues, will cause  soreness.       3. Damage to internal structures:  The nerves to be lesioned may be near blood vessels or    other nerves which can be potentially damaged.       4. Bleeding: Bleeding is more common if the patient is taking blood thinners such as  aspirin, Coumadin, Ticiid, Plavix, etc., or if he/she have some genetic predisposition  such as hemophilia. Bleeding into the spinal canal can cause compression of the spinal  cord with subsequent paralysis.  This would require an emergency surgery to  decompress and there are no guarantees that the patient would recover from the  paralysis.       5. Pneumothorax:  Puncturing of a lung is a possibility, every time a needle is introduced in  the area of the chest or upper back.  Pneumothorax refers to free air around the  collapsed lung(s), inside of the thoracic cavity (chest cavity).  Another two possible  complications related to a similar event would include: Hemothorax and Chylothorax.   These are variations of the Pneumothorax, where instead of air around the collapsed  lung(s), you may have blood or chyle, respectively.       6. Spinal headaches: They may occur with any procedures in the area of the spine.       7. Persistent CSF (Cerebro-Spinal Fluid) leakage: This is a rare problem, but may occur  with prolonged intrathecal or epidural catheters either due to the formation of a fistulous  track or a dural tear.       8. Nerve damage: By working so close to the spinal cord, there is always a possibility of  nerve damage, which could be as serious as a permanent spinal cord injury with  paralysis.       9. Death:  Although rare, severe deadly allergic reactions known as "Anaphylactic  reaction" can occur to any of the medications used.      10. Worsening of the symptoms:  We can always make thing worse.  What are the chances of something like this happening? Chances of any of this occuring are extremely low.  By statistics, you have more of a chance of  getting killed in a motor vehicle accident: while driving to the hospital than any of the above occurring .  Nevertheless, you should  be aware that they are possibilities.  In general, it is similar to taking a shower.  Everybody knows that you can slip, hit your head and get killed.  Does that mean that you should not shower again?  Nevertheless always keep in mind that statistics do not mean anything if you happen to be on the wrong side of them.  Even if a procedure has a 1 (one) in a 1,000,000 (million) chance of going wrong, it you happen to be that one..Also, keep in mind that by statistics, you have more of a chance of having something go wrong when taking medications.  Who should not have this procedure? If you are on a blood thinning medication (e.g. Coumadin, Plavix, see list of "Blood Thinners"), or if you have an active infection going on, you should not have the procedure.  If you are taking any blood thinners, please inform your physician.  How should I prepare for this procedure?  Do not eat or drink anything at least six hours prior to the procedure.  Bring a driver with you .  It cannot be a taxi.  Come accompanied by an adult that can drive you back, and that is strong enough to help you if your legs get weak or numb from the local anesthetic.  Take all of your medicines the morning of the procedure with just enough water to swallow them.  If you have diabetes, make sure that you are scheduled to have your procedure done first thing in the morning, whenever possible.  If you have diabetes, take only half of your insulin dose and notify our nurse that you have done so as soon as you arrive at the clinic.  If you are diabetic, but only take blood sugar pills (oral hypoglycemic), then do not take them on the morning of your procedure.  You may take them after you have had the procedure.  Do not take aspirin or any aspirin-containing medications, at least eleven (11) days prior to  the procedure.  They may prolong bleeding.  Wear loose fitting clothing that may be easy to take off and that you would not mind if it got stained with Betadine or blood.  Do not wear any jewelry or perfume  Remove any nail coloring.  It will interfere with some of our monitoring equipment.  NOTE: Remember that this is not meant to be interpreted as a complete list of all possible complications.  Unforeseen problems may occur.  BLOOD THINNERS The following drugs contain aspirin or other products, which can cause increased bleeding during surgery and should not be taken for 2 weeks prior to and 1 week after surgery.  If you should need take something for relief of minor pain, you may take acetaminophen which is found in Tylenol,m Datril, Anacin-3 and Panadol. It is not blood thinner. The products listed below are.  Do not take any of the products listed below in addition to any listed on your instruction sheet.  A.P.C or A.P.C with Codeine Codeine Phosphate Capsules #3 Ibuprofen Ridaura  ABC compound Congesprin Imuran rimadil  Advil Cope Indocin Robaxisal  Alka-Seltzer Effervescent Pain Reliever and Antacid Coricidin or Coricidin-D  Indomethacin Rufen  Alka-Seltzer plus Cold Medicine Cosprin Ketoprofen S-A-C Tablets  Anacin Analgesic Tablets or Capsules Coumadin Korlgesic Salflex  Anacin Extra Strength Analgesic tablets or capsules CP-2 Tablets Lanoril Salicylate  Anaprox Cuprimine Capsules Levenox Salocol  Anexsia-D Dalteparin Magan Salsalate  Anodynos Darvon compound Magnesium Salicylate Sine-off  Ansaid Dasin Capsules Magsal  Sodium Salicylate  Anturane Depen Capsules Marnal Soma  APF Arthritis pain formula Dewitt's Pills Measurin Stanback  Argesic Dia-Gesic Meclofenamic Sulfinpyrazone  Arthritis Bayer Timed Release Aspirin Diclofenac Meclomen Sulindac  Arthritis pain formula Anacin Dicumarol Medipren Supac  Analgesic (Safety coated) Arthralgen Diffunasal Mefanamic Suprofen  Arthritis  Strength Bufferin Dihydrocodeine Mepro Compound Suprol  Arthropan liquid Dopirydamole Methcarbomol with Aspirin Synalgos  ASA tablets/Enseals Disalcid Micrainin Tagament  Ascriptin Doan's Midol Talwin  Ascriptin A/D Dolene Mobidin Tanderil  Ascriptin Extra Strength Dolobid Moblgesic Ticlid  Ascriptin with Codeine Doloprin or Doloprin with Codeine Momentum Tolectin  Asperbuf Duoprin Mono-gesic Trendar  Aspergum Duradyne Motrin or Motrin IB Triminicin  Aspirin plain, buffered or enteric coated Durasal Myochrisine Trigesic  Aspirin Suppositories Easprin Nalfon Trillsate  Aspirin with Codeine Ecotrin Regular or Extra Strength Naprosyn Uracel  Atromid-S Efficin Naproxen Ursinus  Auranofin Capsules Elmiron Neocylate Vanquish  Axotal Emagrin Norgesic Verin  Azathioprine Empirin or Empirin with Codeine Normiflo Vitamin E  Azolid Emprazil Nuprin Voltaren  Bayer Aspirin plain, buffered or children's or timed BC Tablets or powders Encaprin Orgaran Warfarin Sodium  Buff-a-Comp Enoxaparin Orudis Zorpin  Buff-a-Comp with Codeine Equegesic Os-Cal-Gesic   Buffaprin Excedrin plain, buffered or Extra Strength Oxalid   Bufferin Arthritis Strength Feldene Oxphenbutazone   Bufferin plain or Extra Strength Feldene Capsules Oxycodone with Aspirin   Bufferin with Codeine Fenoprofen Fenoprofen Pabalate or Pabalate-SF   Buffets II Flogesic Panagesic   Buffinol plain or Extra Strength Florinal or Florinal with Codeine Panwarfarin   Buf-Tabs Flurbiprofen Penicillamine   Butalbital Compound Four-way cold tablets Penicillin   Butazolidin Fragmin Pepto-Bismol   Carbenicillin Geminisyn Percodan   Carna Arthritis Reliever Geopen Persantine   Carprofen Gold's salt Persistin   Chloramphenicol Goody's Phenylbutazone   Chloromycetin Haltrain Piroxlcam   Clmetidine heparin Plaquenil   Cllnoril Hyco-pap Ponstel   Clofibrate Hydroxy chloroquine Propoxyphen         Before stopping any of these medications, be sure to  consult the physician who ordered them.  Some, such as Coumadin (Warfarin) are ordered to prevent or treat serious conditions such as "deep thrombosis", "pumonary embolisms", and other heart problems.  The amount of time that you may need off of the medication may also vary with the medication and the reason for which you were taking it.  If you are taking any of these medications, please make sure you notify your pain physician before you undergo any procedures.

## 2018-04-03 NOTE — Progress Notes (Signed)
Safety precautions to be maintained throughout the outpatient stay will include: orient to surroundings, keep bed in low position, maintain call bell within reach at all times, provide assistance with transfer out of bed and ambulation.  

## 2018-04-23 ENCOUNTER — Other Ambulatory Visit: Payer: Self-pay

## 2018-04-23 ENCOUNTER — Encounter: Payer: Self-pay | Admitting: Student in an Organized Health Care Education/Training Program

## 2018-04-23 ENCOUNTER — Ambulatory Visit
Admission: RE | Admit: 2018-04-23 | Discharge: 2018-04-23 | Disposition: A | Discharge status: Home / Self Care | Payer: Federal, State, Local not specified - PPO | Source: Ambulatory Visit | Attending: Student in an Organized Health Care Education/Training Program | Admitting: Student in an Organized Health Care Education/Training Program

## 2018-04-23 ENCOUNTER — Ambulatory Visit (HOSPITAL_BASED_OUTPATIENT_CLINIC_OR_DEPARTMENT_OTHER)
Payer: Federal, State, Local not specified - PPO | Admitting: Student in an Organized Health Care Education/Training Program

## 2018-04-23 VITALS — BP 134/78 | HR 81 | Temp 98.1°F | Resp 16 | Ht 63.75 in | Wt 200.0 lb

## 2018-04-23 DIAGNOSIS — G8929 Other chronic pain: Secondary | ICD-10-CM

## 2018-04-23 DIAGNOSIS — M25561 Pain in right knee: Secondary | ICD-10-CM

## 2018-04-23 DIAGNOSIS — G894 Chronic pain syndrome: Secondary | ICD-10-CM

## 2018-04-23 DIAGNOSIS — E669 Obesity, unspecified: Secondary | ICD-10-CM | POA: Insufficient documentation

## 2018-04-23 DIAGNOSIS — M1711 Unilateral primary osteoarthritis, right knee: Secondary | ICD-10-CM | POA: Diagnosis not present

## 2018-04-23 MED ORDER — DEXAMETHASONE SODIUM PHOSPHATE 10 MG/ML IJ SOLN
10.0000 mg | Freq: Once | INTRAMUSCULAR | Status: AC
  Administered 2018-04-23: 10 mg

## 2018-04-23 MED ORDER — ROPIVACAINE HCL 2 MG/ML IJ SOLN
INTRAMUSCULAR | Status: AC
  Filled 2018-04-23: qty 10

## 2018-04-23 MED ORDER — LIDOCAINE HCL 2 % IJ SOLN
20.0000 mL | Freq: Once | INTRAMUSCULAR | Status: AC
  Administered 2018-04-23: 400 mg

## 2018-04-23 MED ORDER — LIDOCAINE HCL 2 % IJ SOLN
INTRAMUSCULAR | Status: AC
  Filled 2018-04-23: qty 20

## 2018-04-23 MED ORDER — ROPIVACAINE HCL 2 MG/ML IJ SOLN
10.0000 mL | Freq: Once | INTRAMUSCULAR | Status: AC
  Administered 2018-04-23: 10 mL

## 2018-04-23 MED ORDER — DICLOFENAC SODIUM 75 MG PO TBEC: 75.0000 mg | DELAYED_RELEASE_TABLET | Freq: Every day | ORAL | 2 refills | Status: AC

## 2018-04-23 MED ORDER — DEXAMETHASONE SODIUM PHOSPHATE 10 MG/ML IJ SOLN
INTRAMUSCULAR | Status: AC
  Filled 2018-04-23: qty 1

## 2018-04-23 NOTE — Progress Notes (Signed)
Safety precautions to be maintained throughout the outpatient stay will include: orient to surroundings, keep bed in low position, maintain call bell within reach at all times, provide assistance with transfer out of bed and ambulation.  

## 2018-04-23 NOTE — Progress Notes (Signed)
Patient's Name: Meredith Caldwell  MRN: [id number]  Referring Provider: Baxter Hire, MD  DOB: 1946-03-09  PCP: Baxter Hire, MD  DOS: 04/23/2018  Note by: Gillis Santa, MD  Service setting: Ambulatory outpatient  Specialty: Interventional Pain Management  Patient type: Established  Location: [facility] (AMB) Pain Management Facility  Visit type: Interventional Procedure   Primary Reason for Visit: Interventional Pain Management Treatment. CC: Knee Pain (bilaterally)  Procedure:          Anesthesia, Analgesia, Anxiolysis:  Type: Genicular Nerves Block (Superior-lateral, Superior-medial, and Inferior-medial Genicular Nerves) #1  CPT: 95188      Primary Purpose: Diagnostic Region: Lateral, Anterior, and Medial aspects of the knee joint, above and below the knee joint proper. Level: Superior and inferior to the knee joint. Target Area: For Genicular Nerve block(s), the targets are: the superior-lateral genicular nerve, located in the lateral distal portion of the femoral shaft as it curves to form the lateral epicondyle, in the region of the distal femoral metaphysis; the superior-medial genicular nerve, located in the medial distal portion of the femoral shaft as it curves to form the medial epicondyle; and the inferior-medial genicular nerve, located in the medial, proximal portion of the tibial shaft, as it curves to form the medial epicondyle, in the region of the proximal tibial metaphysis. Approach: Anterior, percutaneous, ipsilateral approach. Laterality: Right knee  Type: Local Anesthesia Indication(s): Analgesia         Route: Infiltration ([place]/IM) IV Access: Declined Sedation: Declined  Local Anesthetic: Lidocaine 1-2%  Position: Modified Fowler's position with pillows under the targeted knee(s).   Indications: 1. Primary osteoarthritis of right knee   2. Chronic pain of right knee   3. Obesity, unspecified classification, unspecified obesity type, unspecified whether serious  comorbidity present   4. Chronic pain syndrome    Pain Score: Pre-procedure: 5 /10 Post-procedure: 0-No pain/10  Pre-op Assessment:  Meredith Caldwell is a 72 y.o. (year old), female patient, seen today for interventional treatment. She  has a past surgical history that includes Tonsillectomy and adenoidectomy (1950); Appendectomy (1960); Cholecystectomy (1972); Abdominal hysterectomy (1991); Breast surgery (1980); Excision/release bursa hip (Left, 1997); Liver biopsy (2005); Melanoma excision (Left, 2008); and Knee surgery (Right, 2013). Meredith Caldwell has a current medication list which includes the following prescription(s): albuterol, cetirizine, colesevelam, diclofenac, fish oil-krill oil, furosemide, losartan, metformin, montelukast, multivitamin with minerals, nabumetone, paroxetine, penciclovir, zolpidem, aspirin ec, and naproxen sodium. Her primarily concern today is the Knee Pain (bilaterally)  Initial Vital Signs:  Pulse/HCG Rate: 77  Temp: 98.1 F (36.7 C) Resp: 18 BP: (!) 146/75 SpO2: 98 %  BMI: Estimated body mass index is 34.6 kg/m as calculated from the following:   Height as of this encounter: 5' 3.75" (1.619 m).   Weight as of this encounter: 200 lb (90.7 kg).  Risk Assessment: Allergies: Reviewed. She is allergic to statins; theophyllines; and zetia [ezetimibe].  Allergy Precautions: None required Coagulopathies: Reviewed. None identified.  Blood-thinner therapy: None at this time Active Infection(s): Reviewed. None identified. Meredith Caldwell is afebrile  Site Confirmation: Meredith Caldwell was asked to confirm the procedure and laterality before marking the site Procedure checklist: Completed Consent: Before the procedure and under the influence of no sedative(s), amnesic(s), or anxiolytics, the patient was informed of the treatment options, risks and possible complications. To fulfill our ethical and legal obligations, as recommended by the American Medical  Association's Code of Ethics, I have informed the patient of my clinical impression; the nature and purpose of the  treatment or procedure; the risks, benefits, and possible complications of the intervention; the alternatives, including doing nothing; the risk(s) and benefit(s) of the alternative treatment(s) or procedure(s); and the risk(s) and benefit(s) of doing nothing. The patient was provided information about the general risks and possible complications associated with the procedure. These may include, but are not limited to: failure to achieve desired goals, infection, bleeding, organ or nerve damage, allergic reactions, paralysis, and death. In addition, the patient was informed of those risks and complications associated to the procedure, such as failure to decrease pain; infection; bleeding; organ or nerve damage with subsequent damage to sensory, motor, and/or autonomic systems, resulting in permanent pain, numbness, and/or weakness of one or several areas of the body; allergic reactions; (i.e.: anaphylactic reaction); and/or death. Furthermore, the patient was informed of those risks and complications associated with the medications. These include, but are not limited to: allergic reactions (i.e.: anaphylactic or anaphylactoid reaction(s)); adrenal axis suppression; blood sugar elevation that in diabetics may result in ketoacidosis or comma; water retention that in patients with history of congestive heart failure may result in shortness of breath, pulmonary edema, and decompensation with resultant heart failure; weight gain; swelling or edema; medication-induced neural toxicity; particulate matter embolism and blood vessel occlusion with resultant organ, and/or nervous system infarction; and/or aseptic necrosis of one or more joints. Finally, the patient was informed that Medicine is not an exact science; therefore, there is also the possibility of unforeseen or unpredictable risks and/or possible  complications that may result in a catastrophic outcome. The patient indicated having understood very clearly. We have given the patient no guarantees and we have made no promises. Enough time was given to the patient to ask questions, all of which were answered to the patient's satisfaction. Meredith Caldwell has indicated that she wanted to continue with the procedure. Attestation: I, the ordering provider, attest that I have discussed with the patient the benefits, risks, side-effects, alternatives, likelihood of achieving goals, and potential problems during recovery for the procedure that I have provided informed consent. Date  Time: 04/23/2018 12:21 PM  Pre-Procedure Preparation:  Monitoring: As per clinic protocol. Respiration, ETCO2, SpO2, BP, heart rate and rhythm monitor placed and checked for adequate function Safety Precautions: Patient was assessed for positional comfort and pressure points before starting the procedure. Time-out: I initiated and conducted the "Time-out" before starting the procedure, as per protocol. The patient was asked to participate by confirming the accuracy of the "Time Out" information. Verification of the correct person, site, and procedure were performed and confirmed by me, the nursing staff, and the patient. "Time-out" conducted as per Joint Commission's Universal Protocol (UP.01.01.01). Time: 1257  Description of Procedure:          Area Prepped: Entire knee area, from mid-thigh to mid-shin, lateral, anterior, and medial aspects. Prepping solution: ChloraPrep (2% chlorhexidine gluconate and 70% isopropyl alcohol) Safety Precautions: Aspiration looking for blood return was conducted prior to all injections. At no point did we inject any substances, as a needle was being advanced. No attempts were made at seeking any paresthesias. Safe injection practices and needle disposal techniques used. Medications properly checked for expiration dates. SDV (single dose vial)  medications used. Description of the Procedure: Protocol guidelines were followed. The patient was placed in position over the procedure table. The target area was identified and the area prepped in the usual manner. Skin & deeper tissues infiltrated with local anesthetic. Appropriate amount of time allowed to pass for local anesthetics to  take effect. The procedure needles were then advanced to the target area. Proper needle placement secured. Negative aspiration confirmed. Solution injected in intermittent fashion, asking for systemic symptoms every 0.5cc of injectate. The needles were then removed and the area cleansed, making sure to leave some of the prepping solution back to take advantage of its long term bactericidal properties.  Vitals:   04/23/18 1238 04/23/18 1255 04/23/18 1305 04/23/18 1315  BP: (!) 146/75 (!) 161/76 (!) 167/57 134/78  Pulse: 77 82 79 81  Resp: 18 19 20 16   Temp: 98.1 F (36.7 C)     TempSrc: Oral     SpO2: 98% 98% 95% 96%  Weight: 200 lb (90.7 kg)     Height: 5' 3.75" (1.619 m)       Start Time: 1257 hrs. End Time:   hrs. Materials:  Needle(s) Type: Spinal Needle Gauge: 25G Length: 3.5-in Medication(s): Please see orders for medications and dosing details. 5 cc solution consisting of 4 cc of 0.2% Marcaine, 1 cc of Decadron 10 mg/cc.  1.5 cc injected at each level above. Imaging Guidance (Non-Spinal):          Type of Imaging Technique: Fluoroscopy Guidance (Non-Spinal) Indication(s): Assistance in needle guidance and placement for procedures requiring needle placement in or near specific anatomical locations not easily accessible without such assistance. Exposure Time: Please see nurses notes. Contrast: None used. Fluoroscopic Guidance: I was personally present during the use of fluoroscopy. "Tunnel Vision Technique" used to obtain the best possible view of the target area. Parallax error corrected before commencing the procedure. "Direction-depth-direction"  technique used to introduce the needle under continuous pulsed fluoroscopy. Once target was reached, antero-posterior, oblique, and lateral fluoroscopic projection used confirm needle placement in all planes. Images permanently stored in EMR. Interpretation: No contrast injected. I personally interpreted the imaging intraoperatively. Adequate needle placement confirmed in multiple planes. Permanent images saved into the patient's record.  Antibiotic Prophylaxis:   Anti-infectives (From admission, onward)   None     Indication(s): None identified  Post-operative Assessment:  Post-procedure Vital Signs:  Pulse/HCG Rate: 81  Temp: 98.1 F (36.7 C) Resp: 16 BP: 134/78 SpO2: 96 %  EBL: None  Complications: No immediate post-treatment complications observed by team, or reported by patient.  Note: The patient tolerated the entire procedure well. A repeat set of vitals were taken after the procedure and the patient was kept under observation following institutional policy, for this type of procedure. Post-procedural neurological assessment was performed, showing return to baseline, prior to discharge. The patient was provided with post-procedure discharge instructions, including a section on how to identify potential problems. Should any problems arise concerning this procedure, the patient was given instructions to immediately contact us, at any time, without hesitation. In any case, we plan to contact the patient by telephone for a follow-up status report regarding this interventional procedure.  Comments:  No additional relevant information.  Plan of Care   Imaging Orders     DG C-Arm 1-60 Min-No Report Procedure Orders    No procedure(s) ordered today    Medications ordered for procedure: Meds ordered this encounter  Medications  . ropivacaine (PF) 2 mg/mL (0.2%) (NAROPIN) injection 10 mL  . lidocaine (XYLOCAINE) 2 % (with pres) injection 400 mg  . dexamethasone (DECADRON)  injection 10 mg  . diclofenac (VOLTAREN) 75 MG EC tablet    Sig: Take 1 tablet (75 mg total) by mouth daily for 90 doses.    Dispense:  30 tablet  Refill:  2   Medications administered: We administered ropivacaine (PF) 2 mg/mL (0.2%), lidocaine, and dexamethasone.  See the medical record for exact dosing, route, and time of administration.  Disposition: Discharge home  Discharge Date & Time: 04/23/2018; 1320 hrs.   Physician-requested Follow-up: Return in about 4 weeks (around 05/21/2018) for Post Procedure Evaluation.  Future Appointments  Date Time Provider [place]  05/22/2018  2:30 PM Gillis Santa, MD [facility] None   Primary Care Physician: Baxter Hire, MD Location: [facility] Note by: Gillis Santa, MD Date: 04/23/2018; Time: 1:31 PM  Disclaimer:  Medicine is not an exact science. The only guarantee in medicine is that nothing is guaranteed. It is important to note that the decision to proceed with this intervention was based on the information collected from the patient. The Data and conclusions were drawn from the patient's questionnaire, the interview, and the physical examination. Because the information was provided in large part by the patient, it cannot be guaranteed that it has not been purposely or unconsciously manipulated. Every effort has been made to obtain as much relevant data as possible for this evaluation. It is important to note that the conclusions that lead to this procedure are derived in large part from the available data. Always take into account that the treatment will also be dependent on availability of resources and existing treatment guidelines, considered by other Pain Management Practitioners as being common knowledge and practice, at the time of the intervention. For Medico-Legal purposes, it is also important to point out that variation in procedural techniques and pharmacological choices are the acceptable  norm. The indications, contraindications, technique, and results of the above procedure should only be interpreted and judged by a Board-Certified Interventional Pain Specialist with extensive familiarity and expertise in the same exact procedure and technique.

## 2018-04-24 ENCOUNTER — Telehealth: Payer: Self-pay | Admitting: Student in an Organized Health Care Education/Training Program

## 2018-04-24 ENCOUNTER — Telehealth: Payer: Self-pay | Admitting: *Deleted

## 2018-04-24 NOTE — Telephone Encounter (Signed)
Attempted to call for post procedure follow-up. Message left. 

## 2018-04-24 NOTE — Telephone Encounter (Signed)
Patient called asking to speak with nurse. She had procedure on Wed with Dr. Holley Raring. Please call patient back.

## 2018-04-24 NOTE — Telephone Encounter (Signed)
Patient asking if she can restart her ASA today. Advised it is ok to do so.

## 2018-05-22 ENCOUNTER — Ambulatory Visit
Payer: Federal, State, Local not specified - PPO | Attending: Student in an Organized Health Care Education/Training Program | Admitting: Student in an Organized Health Care Education/Training Program

## 2018-05-22 ENCOUNTER — Encounter: Payer: Self-pay | Admitting: Student in an Organized Health Care Education/Training Program

## 2018-05-22 ENCOUNTER — Other Ambulatory Visit: Payer: Self-pay

## 2018-05-22 VITALS — BP 146/61 | HR 98 | Temp 98.0°F | Resp 18 | Ht 63.75 in | Wt 200.0 lb

## 2018-05-22 DIAGNOSIS — M1712 Unilateral primary osteoarthritis, left knee: Secondary | ICD-10-CM | POA: Diagnosis not present

## 2018-05-22 DIAGNOSIS — M1711 Unilateral primary osteoarthritis, right knee: Secondary | ICD-10-CM | POA: Diagnosis not present

## 2018-05-22 DIAGNOSIS — G8929 Other chronic pain: Secondary | ICD-10-CM

## 2018-05-22 DIAGNOSIS — M25561 Pain in right knee: Secondary | ICD-10-CM

## 2018-05-22 DIAGNOSIS — G894 Chronic pain syndrome: Secondary | ICD-10-CM

## 2018-05-22 DIAGNOSIS — M25562 Pain in left knee: Secondary | ICD-10-CM | POA: Diagnosis not present

## 2018-05-22 NOTE — Progress Notes (Signed)
Safety precautions to be maintained throughout the outpatient stay will include: orient to surroundings, keep bed in low position, maintain call bell within reach at all times, provide assistance with transfer out of bed and ambulation.  

## 2018-05-22 NOTE — Patient Instructions (Signed)

## 2018-05-22 NOTE — Progress Notes (Signed)
Patient's Name: Meredith Caldwell  MRN: [id number]  Referring Provider: Baxter Hire, MD  DOB: 06/21/1945  PCP: Baxter Hire, MD  DOS: 05/22/2018  Note by: Gillis Santa, MD  Service setting: Ambulatory outpatient  Specialty: Interventional Pain Management  Location: [facility] (AMB) Pain Management Facility    Patient type: Established   Primary Reason(s) for Visit: Encounter for post-procedure evaluation of chronic illness with mild to moderate exacerbation CC: Knee Pain (left is worse)  HPI  Meredith Caldwell is a 73 y.o. year old, female patient, who comes today for a post-procedure evaluation. She has Primary osteoarthritis of right knee; Obesity; Diabetes mellitus type 2, uncomplicated ([place]); Chronic pain of left knee; Chronic pain syndrome; and Arthritis of left knee on their problem list. Her primarily concern today is the Knee Pain (left is worse)  Pain Assessment: Location: Right, Left Knee(right knee (1); left knee (6)) Radiating: denies Onset: More than a month ago Duration: Chronic pain Quality: Throbbing(pertaining to left knee) Severity: 1 /10 (subjective, self-reported pain score)  Note: Reported level is compatible with observation.                         When using our objective Pain Scale, levels between 6 and 10/10 are said to belong in an emergency room, as it progressively worsens from a 6/10, described as severely limiting, requiring emergency care not usually available at an outpatient pain management facility. At a 6/10 level, communication becomes difficult and requires great effort. Assistance to reach the emergency department may be required. Facial flushing and profuse sweating along with potentially dangerous increases in heart rate and blood pressure will be evident. Effect on ADL: left knee pain is cause of limited daily activitiy. Timing: Intermittent(depends on activity) Modifying factors: stiing, procedure, elevation BP: (!) 146/61  HR: 98  [name] comes  in today for post-procedure evaluation.  Further details on both, my assessment(s), as well as the proposed treatment plan, please see below.  Post-Procedure Assessment  04/24/2018 Procedure: Right knee genicular nerve block Pre-procedure pain score:  5/10 Post-procedure pain score: 0/10         Influential Factors: BMI: 34.60 kg/m Intra-procedural challenges: None observed.         Assessment challenges: None detected.              Reported side-effects: None.        Post-procedural adverse reactions or complications: None reported         Sedation: Please see nurses note. When no sedatives are used, the analgesic levels obtained are directly associated to the effectiveness of the local anesthetics. However, when sedation is provided, the level of analgesia obtained during the initial 1 hour following the intervention, is believed to be the result of a combination of factors. These factors may include, but are not limited to: 1. The effectiveness of the local anesthetics used. 2. The effects of the analgesic(s) and/or anxiolytic(s) used. 3. The degree of discomfort experienced by the patient at the time of the procedure. 4. The patients ability and reliability in recalling and recording the events. 5. The presence and influence of possible secondary gains and/or psychosocial factors. Reported result: Relief experienced during the 1st hour after the procedure: 100 % (Ultra-Short Term Relief)            Interpretative annotation: Clinically appropriate result. Analgesia during this period is likely to be Local Anesthetic and/or IV Sedative (Analgesic/Anxiolytic) related.  Effects of local anesthetic: The analgesic effects attained during this period are directly associated to the localized infiltration of local anesthetics and therefore cary significant diagnostic value as to the etiological location, or anatomical origin, of the pain. Expected duration of relief is directly dependent  on the pharmacodynamics of the local anesthetic used. Long-acting (4-6 hours) anesthetics used.  Reported result: Relief during the next 4 to 6 hour after the procedure: 100 % (Short-Term Relief)            Interpretative annotation: Clinically appropriate result. Analgesia during this period is likely to be Local Anesthetic-related.          Long-term benefit: Defined as the period of time past the expected duration of local anesthetics (1 hour for short-acting and 4-6 hours for long-acting). With the possible exception of prolonged sympathetic blockade from the local anesthetics, benefits during this period are typically attributed to, or associated with, other factors such as analgesic sensory neuropraxia, antiinflammatory effects, or beneficial biochemical changes provided by agents other than the local anesthetics.  Reported result: Extended relief following procedure: 100 %(pt states she continues to experience relief from procedure; "I can walk on that knee now without feeling like I need a walker to get around.") (Long-Term Relief)            Interpretative annotation: Clinically possible results. Good relief. No permanent benefit expected. Inflammation plays a part in the etiology to the pain.          Current benefits: Defined as reported results that persistent at this point in time.   Analgesia: 100 %            Function: Somewhat improved ROM: Somewhat improved Interpretative annotation: Ongoing benefit. Therapeutic benefit observed. Effective therapeutic approach.          Interpretation: Results would suggest a successful diagnostic and therapeutic intervention.                  Plan:  Please see "Plan of Care" for details.                Laboratory Chemistry  Inflammation Markers (CRP: Acute Phase) (ESR: Chronic Phase) No results found for: CRP, ESRSEDRATE, LATICACIDVEN                       Rheumatology Markers No results found for: RF, ANA, LABURIC, URICUR, LYMEIGGIGMAB,  LYMEABIGMQN, HLAB27                      Renal Function Markers No results found for: BUN, CREATININE, BCR, GFRAA, GFRNONAA, LABVMA, EPIRU, [id number], NOREPRU, NOREPI24HUR, DOPARU, [id number]                           Hepatic Function Markers No results found for: AST, ALT, ALBUMIN, ALKPHOS, HCVAB, AMYLASE, LIPASE, AMMONIA                      Electrolytes No results found for: NA, K, CL, CALCIUM, MG, PHOS                      Neuropathy Markers No results found for: VITAMINB12, FOLATE, HGBA1C, HIV                      CNS Tests No results found for: COLORCSF, APPEARCSF, RBCCOUNTCSF, WBCCSF, POLYSCSF, LYMPHSCSF, EOSCSF, PROTEINCSF, GLUCCSF, JCVIRUS, CSFOLI, IGGCSF  Bone Pathology Markers No results found for: VD25OH, [id number], [id number], [id number], 25OHVITD1, 25OHVITD2, 25OHVITD3, TESTOFREE, TESTOSTERONE                       Coagulation Parameters No results found for: INR, LABPROT, APTT, PLT, DDIMER, LABHEMA, VITAMINK1                      Cardiovascular Markers No results found for: BNP, CKTOTAL, CKMB, TROPONINI, HGB, HCT                       CA Markers No results found for: CEA, CA125, LABCA2                      Note: Lab results reviewed.  Recent Diagnostic Imaging Results  DG C-Arm 1-60 Min-No Report Fluoroscopy was utilized by the requesting physician.  No radiographic  interpretation.   Complexity Note: Imaging results reviewed. Results shared with Ms. Keeble, using Layman's terms.                         Meds   Current Outpatient Medications:  .  albuterol (PROVENTIL HFA;VENTOLIN HFA) 108 (90 Base) MCG/ACT inhaler, Inhale into the lungs., Disp: , Rfl:  .  aspirin EC 81 MG tablet, Take by mouth., Disp: , Rfl:  .  cetirizine (ZYRTEC) 10 MG tablet, Take 10 mg by mouth daily., Disp: , Rfl:  .  colesevelam (WELCHOL) 625 MG tablet, Take by mouth., Disp: , Rfl:  .  diclofenac (VOLTAREN) 75 MG EC tablet, Take 1 tablet (75 mg total) by  mouth daily for 90 doses., Disp: 30 tablet, Rfl: 2 .  FISH OIL-KRILL OIL PO, Take 360 mg by mouth., Disp: , Rfl:  .  furosemide (LASIX) 40 MG tablet, Take by mouth., Disp: , Rfl:  .  losartan (COZAAR) 50 MG tablet, take 1 tablet by mouth once daily, Disp: , Rfl:  .  metFORMIN (GLUCOPHAGE) 1000 MG tablet, Take 1,000 mg by mouth 2 (two) times daily with a meal., Disp: , Rfl:  .  montelukast (SINGULAIR) 10 MG tablet, Take 10 mg by mouth at bedtime., Disp: , Rfl:  .  Multiple Vitamin (MULTIVITAMIN WITH MINERALS) TABS tablet, Take 1 tablet by mouth daily., Disp: , Rfl:  .  nabumetone (RELAFEN) 500 MG tablet, Take 500 mg by mouth daily., Disp: , Rfl:  .  naproxen sodium (ALEVE) 220 MG tablet, Take 220 mg by mouth daily as needed. , Disp: , Rfl:  .  PARoxetine (PAXIL-CR) 25 MG 24 hr tablet, Take 25 mg by mouth daily., Disp: , Rfl:  .  penciclovir (DENAVIR) 1 % cream, Apply 1 application topically every 2 (two) hours., Disp: , Rfl:  .  zolpidem (AMBIEN) 5 MG tablet, Take 5 mg by mouth at bedtime as needed for sleep., Disp: , Rfl:   ROS  Constitutional: Denies any fever or chills Gastrointestinal: No reported hemesis, hematochezia, vomiting, or acute GI distress Musculoskeletal: Denies any acute onset joint swelling, redness, loss of ROM, or weakness Neurological: No reported episodes of acute onset apraxia, aphasia, dysarthria, agnosia, amnesia, paralysis, loss of coordination, or loss of consciousness  Allergies  Ms. Rynders is allergic to statins; theophyllines; and zetia [ezetimibe].  [place]  Drug: Ms. Mclees  reports no history of drug use. Alcohol:  reports current alcohol use. Tobacco:  reports that she quit smoking about 49 years ago. She  has never used smokeless tobacco. Medical:  has a past medical history of Bone spur (2000), Diabetes ([place]) (2005), Edema (2000), Melanoma ([place]) (2008), Neuroma (2006), and Vaso vagal episode (1992). Surgical: Ms. Gallego  has a past surgical  history that includes Tonsillectomy and adenoidectomy (1950); Appendectomy (1960); Cholecystectomy (1972); Abdominal hysterectomy (1991); Breast surgery (1980); Excision/release bursa hip (Left, 1997); Liver biopsy (2005); Melanoma excision (Left, 2008); and Knee surgery (Right, 2013). Family: family history is not on file.  Constitutional Exam  General appearance: Well nourished, well developed, and well hydrated. In no apparent acute distress Vitals:   05/22/18 1436  BP: (!) 146/61  Pulse: 98  Resp: 18  Temp: 98 F (36.7 C)  TempSrc: Oral  SpO2: 97%  Weight: 200 lb (90.7 kg)  Height: 5' 3.75" (1.619 m)   BMI Assessment: Estimated body mass index is 34.6 kg/m as calculated from the following:   Height as of this encounter: 5' 3.75" (1.619 m).   Weight as of this encounter: 200 lb (90.7 kg).  BMI interpretation table: BMI level Category Range association with higher incidence of chronic pain  <18 kg/m2 Underweight   18.5-24.9 kg/m2 Ideal body weight   25-29.9 kg/m2 Overweight Increased incidence by 20%  30-34.9 kg/m2 Obese (Class I) Increased incidence by 68%  35-39.9 kg/m2 Severe obesity (Class II) Increased incidence by 136%  >40 kg/m2 Extreme obesity (Class III) Increased incidence by 254%   Patient's current BMI Ideal Body weight  Body mass index is 34.6 kg/m. Ideal body weight: 54.1 kg (119 lb 5.2 oz) Adjusted ideal body weight: 68.8 kg (151 lb 9.5 oz)   BMI Readings from Last 4 Encounters:  05/22/18 34.60 kg/m  04/23/18 34.60 kg/m  04/03/18 35.98 kg/m   Wt Readings from Last 4 Encounters:  05/22/18 200 lb (90.7 kg)  04/23/18 200 lb (90.7 kg)  04/03/18 208 lb (94.3 kg)  Psych/Mental status: Alert, oriented x 3 (person, place, & time)       Eyes: PERLA Respiratory: No evidence of acute respiratory distress  Cervical Spine Area Exam  Skin & Axial Inspection: No masses, redness, edema, swelling, or associated skin lesions Alignment: Symmetrical Functional ROM:  Unrestricted ROM      Stability: No instability detected Muscle Tone/Strength: Functionally intact. No obvious neuro-muscular anomalies detected. Sensory (Neurological): Unimpaired Palpation: No palpable anomalies              Upper Extremity (UE) Exam    Side: Right upper extremity  Side: Left upper extremity  Skin & Extremity Inspection: Skin color, temperature, and hair growth are WNL. No peripheral edema or cyanosis. No masses, redness, swelling, asymmetry, or associated skin lesions. No contractures.  Skin & Extremity Inspection: Skin color, temperature, and hair growth are WNL. No peripheral edema or cyanosis. No masses, redness, swelling, asymmetry, or associated skin lesions. No contractures.  Functional ROM: Unrestricted ROM          Functional ROM: Unrestricted ROM          Muscle Tone/Strength: Functionally intact. No obvious neuro-muscular anomalies detected.  Muscle Tone/Strength: Functionally intact. No obvious neuro-muscular anomalies detected.  Sensory (Neurological): Unimpaired          Sensory (Neurological): Unimpaired          Palpation: No palpable anomalies              Palpation: No palpable anomalies              Provocative Test(s):  Phalen's test: deferred Tinel's test:  deferred Apley's scratch test (touch opposite shoulder):  Action 1 (Across chest): deferred Action 2 (Overhead): deferred Action 3 (LB reach): deferred   Provocative Test(s):  Phalen's test: deferred Tinel's test: deferred Apley's scratch test (touch opposite shoulder):  Action 1 (Across chest): deferred Action 2 (Overhead): deferred Action 3 (LB reach): deferred    Thoracic Spine Area Exam  Skin & Axial Inspection: No masses, redness, or swelling Alignment: Symmetrical Functional ROM: Unrestricted ROM Stability: No instability detected Muscle Tone/Strength: Functionally intact. No obvious neuro-muscular anomalies detected. Sensory (Neurological): Unimpaired Muscle strength & Tone: No  palpable anomalies  Lumbar Spine Area Exam  Skin & Axial Inspection: No masses, redness, or swelling Alignment: Symmetrical Functional ROM: Unrestricted ROM       Stability: No instability detected Muscle Tone/Strength: Functionally intact. No obvious neuro-muscular anomalies detected. Sensory (Neurological): Unimpaired Palpation: No palpable anomalies       Provocative Tests: Hyperextension/rotation test: deferred today       Lumbar quadrant test (Kemp's test): deferred today       Lateral bending test: deferred today       Patrick's Maneuver: deferred today                   FABER* test: deferred today                   S-I anterior distraction/compression test: deferred today         S-I lateral compression test: deferred today         S-I Thigh-thrust test: deferred today         S-I Gaenslen's test: deferred today         *(Flexion, ABduction and External Rotation)  Gait & Posture Assessment  Ambulation: Unassisted Gait: Relatively normal for age and body habitus Posture: WNL   Lower Extremity Exam    Side: Right lower extremity  Side: Left lower extremity  Stability: No instability observed          Stability: No instability observed          Skin & Extremity Inspection: Skin color, temperature, and hair growth are WNL. No peripheral edema or cyanosis. No masses, redness, swelling, asymmetry, or associated skin lesions. No contractures.  Skin & Extremity Inspection: Skin color, temperature, and hair growth are WNL. No peripheral edema or cyanosis. No masses, redness, swelling, asymmetry, or associated skin lesions. No contractures.  Functional ROM: Improved after treatment for knee joint          Functional ROM: Decreased ROM for knee joint          Muscle Tone/Strength: Functionally intact. No obvious neuro-muscular anomalies detected.  Muscle Tone/Strength: Functionally intact. No obvious neuro-muscular anomalies detected.  Sensory (Neurological): Improved        Sensory  (Neurological): Arthropathic arthralgia        DTR: Patellar: 1+: trace Achilles: deferred today Plantar: deferred today  DTR: Patellar: 1+: trace Achilles: deferred today Plantar: deferred today  Palpation: No palpable anomalies  Palpation: No palpable anomalies   Assessment  Primary Diagnosis & Pertinent Problem List: The primary encounter diagnosis was Arthritis of left knee. Diagnoses of Primary osteoarthritis of right knee, Chronic pain of right knee, Chronic pain of left knee, and Chronic pain syndrome were also pertinent to this visit.  Status Diagnosis  Having a Flare-up Having a Flare-up Improved 1. Arthritis of left knee   2. Primary osteoarthritis of right knee   3. Chronic pain of right knee   4.  Chronic pain of left knee   5. Chronic pain syndrome     Problems updated and reviewed during this visit: Problem  Arthritis of Left Knee  Chronic Pain of Left Knee   Patient follows up today status post right knee genicular nerve block.  She is endorsing significant pain relief and improvement in range of motion and ability to walk.  She is utilizing her cane less frequently to ambulate as result of pain relief for her right knee.  She is interested in getting the left knee done.  Risks and benefits of left knee genicular nerve block were discussed and reviewed with the patient and patient would like to proceed.  Plan: -Status post right knee genicular nerve block which is providing the patient with therapeutic benefit.  Can consider right genicular RFA in the future -Plan for left knee genicular diagnostic nerve block #1  Plan of Care   Lab-work, procedure(s), and/or referral(s): Orders Placed This Encounter  Procedures  . GENICULAR NERVE BLOCK    Provider-requested follow-up: Return in about 1 week (around 05/29/2018) for Procedure.  Time Note: Greater than 50% of the 25 minute(s) of face-to-face time spent with Ms. Mickel, was spent in counseling/coordination of  care regarding: Ms. Sandora primary cause of pain, the treatment plan, treatment alternatives, the risks and possible complications of proposed treatment, going over the informed consent, the results, interpretation and significance of  her recent diagnostic interventional treatment(s) and realistic expectations.  Future Appointments  Date Time Provider [place]  06/02/2018 11:30 AM Gillis Santa, MD [facility] None    Primary Care Physician: Baxter Hire, MD Location: [facility] Note by: Gillis Santa, M.D Date: 05/22/2018; Time: 3:04 PM  Patient Instructions   GENERAL RISKS AND COMPLICATIONS  What are the risk, side effects and possible complications? Generally speaking, most procedures are safe.  However, with any procedure there are risks, side effects, and the possibility of complications.  The risks and complications are dependent upon the sites that are lesioned, or the type of nerve block to be performed.  The closer the procedure is to the spine, the more serious the risks are.  Great care is taken when placing the radio frequency needles, block needles or lesioning probes, but sometimes complications can occur. 1. Infection: Any time there is an injection through the skin, there is a risk of infection.  This is why sterile conditions are used for these blocks.  There are four possible types of infection. 1. Localized skin infection. 2. Central Nervous System Infection-This can be in the form of Meningitis, which can be deadly. 3. Epidural Infections-This can be in the form of an epidural abscess, which can cause pressure inside of the spine, causing compression of the spinal cord with subsequent paralysis. This would require an emergency surgery to decompress, and there are no guarantees that the patient would recover from the paralysis. 4. Discitis-This is an infection of the intervertebral discs.  It occurs in about 1% of discography  procedures.  It is difficult to treat and it may lead to surgery.        2. Pain: the needles have to go through skin and soft tissues, will cause soreness.       3. Damage to internal structures:  The nerves to be lesioned may be near blood vessels or    other nerves which can be potentially damaged.       4. Bleeding: Bleeding is more common if the patient is  taking blood thinners such as  aspirin, Coumadin, Ticiid, Plavix, etc., or if he/she have some genetic predisposition  such as hemophilia. Bleeding into the spinal canal can cause compression of the spinal  cord with subsequent paralysis.  This would require an emergency surgery to  decompress and there are no guarantees that the patient would recover from the  paralysis.       5. Pneumothorax:  Puncturing of a lung is a possibility, every time a needle is introduced in  the area of the chest or upper back.  Pneumothorax refers to free air around the  collapsed lung(s), inside of the thoracic cavity (chest cavity).  Another two possible  complications related to a similar event would include: Hemothorax and Chylothorax.   These are variations of the Pneumothorax, where instead of air around the collapsed  lung(s), you may have blood or chyle, respectively.       6. Spinal headaches: They may occur with any procedures in the area of the spine.       7. Persistent CSF (Cerebro-Spinal Fluid) leakage: This is a rare problem, but may occur  with prolonged intrathecal or epidural catheters either due to the formation of a fistulous  track or a dural tear.       8. Nerve damage: By working so close to the spinal cord, there is always a possibility of  nerve damage, which could be as serious as a permanent spinal cord injury with  paralysis.       9. Death:  Although rare, severe deadly allergic reactions known as "Anaphylactic  reaction" can occur to any of the medications used.      10. Worsening of the symptoms:  We can always make thing worse.  What  are the chances of something like this happening? Chances of any of this occuring are extremely low.  By statistics, you have more of a chance of getting killed in a motor vehicle accident: while driving to the hospital than any of the above occurring .  Nevertheless, you should be aware that they are possibilities.  In general, it is similar to taking a shower.  Everybody knows that you can slip, hit your head and get killed.  Does that mean that you should not shower again?  Nevertheless always keep in mind that statistics do not mean anything if you happen to be on the wrong side of them.  Even if a procedure has a 1 (one) in a 1,000,000 (million) chance of going wrong, it you happen to be that one..Also, keep in mind that by statistics, you have more of a chance of having something go wrong when taking medications.  Who should not have this procedure? If you are on a blood thinning medication (e.g. Coumadin, Plavix, see list of "Blood Thinners"), or if you have an active infection going on, you should not have the procedure.  If you are taking any blood thinners, please inform your physician.  How should I prepare for this procedure?  Do not eat or drink anything at least six hours prior to the procedure.  Bring a driver with you .  It cannot be a taxi.  Come accompanied by an adult that can drive you back, and that is strong enough to help you if your legs get weak or numb from the local anesthetic.  Take all of your medicines the morning of the procedure with just enough water to swallow them.  If you have diabetes, make sure that  you are scheduled to have your procedure done first thing in the morning, whenever possible.  If you have diabetes, take only half of your insulin dose and notify our nurse that you have done so as soon as you arrive at the clinic.  If you are diabetic, but only take blood sugar pills (oral hypoglycemic), then do not take them on the morning of your procedure.   You may take them after you have had the procedure.  Do not take aspirin or any aspirin-containing medications, at least eleven (11) days prior to the procedure.  They may prolong bleeding.  Wear loose fitting clothing that may be easy to take off and that you would not mind if it got stained with Betadine or blood.  Do not wear any jewelry or perfume  Remove any nail coloring.  It will interfere with some of our monitoring equipment.  NOTE: Remember that this is not meant to be interpreted as a complete list of all possible complications.  Unforeseen problems may occur.  BLOOD THINNERS The following drugs contain aspirin or other products, which can cause increased bleeding during surgery and should not be taken for 2 weeks prior to and 1 week after surgery.  If you should need take something for relief of minor pain, you may take acetaminophen which is found in Tylenol,m Datril, Anacin-3 and Panadol. It is not blood thinner. The products listed below are.  Do not take any of the products listed below in addition to any listed on your instruction sheet.  A.P.C or A.P.C with Codeine Codeine Phosphate Capsules #3 Ibuprofen Ridaura  ABC compound Congesprin Imuran rimadil  Advil Cope Indocin Robaxisal  Alka-Seltzer Effervescent Pain Reliever and Antacid Coricidin or Coricidin-D  Indomethacin Rufen  Alka-Seltzer plus Cold Medicine Cosprin Ketoprofen S-A-C Tablets  Anacin Analgesic Tablets or Capsules Coumadin Korlgesic Salflex  Anacin Extra Strength Analgesic tablets or capsules CP-2 Tablets Lanoril Salicylate  Anaprox Cuprimine Capsules Levenox Salocol  Anexsia-D Dalteparin Magan Salsalate  Anodynos Darvon compound Magnesium Salicylate Sine-off  Ansaid Dasin Capsules Magsal Sodium Salicylate  Anturane Depen Capsules Marnal Soma  APF Arthritis pain formula Dewitt's Pills Measurin Stanback  Argesic Dia-Gesic Meclofenamic Sulfinpyrazone  Arthritis Bayer Timed Release Aspirin Diclofenac  Meclomen Sulindac  Arthritis pain formula Anacin Dicumarol Medipren Supac  Analgesic (Safety coated) Arthralgen Diffunasal Mefanamic Suprofen  Arthritis Strength Bufferin Dihydrocodeine Mepro Compound Suprol  Arthropan liquid Dopirydamole Methcarbomol with Aspirin Synalgos  ASA tablets/Enseals Disalcid Micrainin Tagament  Ascriptin Doan's Midol Talwin  Ascriptin A/D Dolene Mobidin Tanderil  Ascriptin Extra Strength Dolobid Moblgesic Ticlid  Ascriptin with Codeine Doloprin or Doloprin with Codeine Momentum Tolectin  Asperbuf Duoprin Mono-gesic Trendar  Aspergum Duradyne Motrin or Motrin IB Triminicin  Aspirin plain, buffered or enteric coated Durasal Myochrisine Trigesic  Aspirin Suppositories Easprin Nalfon Trillsate  Aspirin with Codeine Ecotrin Regular or Extra Strength Naprosyn Uracel  Atromid-S Efficin Naproxen Ursinus  Auranofin Capsules Elmiron Neocylate Vanquish  Axotal Emagrin Norgesic Verin  Azathioprine Empirin or Empirin with Codeine Normiflo Vitamin E  Azolid Emprazil Nuprin Voltaren  Bayer Aspirin plain, buffered or children's or timed BC Tablets or powders Encaprin Orgaran Warfarin Sodium  Buff-a-Comp Enoxaparin Orudis Zorpin  Buff-a-Comp with Codeine Equegesic Os-Cal-Gesic   Buffaprin Excedrin plain, buffered or Extra Strength Oxalid   Bufferin Arthritis Strength Feldene Oxphenbutazone   Bufferin plain or Extra Strength Feldene Capsules Oxycodone with Aspirin   Bufferin with Codeine Fenoprofen Fenoprofen Pabalate or Pabalate-SF   Buffets II Flogesic Panagesic   Buffinol plain or  Extra Strength Florinal or Florinal with Codeine Panwarfarin   Buf-Tabs Flurbiprofen Penicillamine   Butalbital Compound Four-way cold tablets Penicillin   Butazolidin Fragmin Pepto-Bismol   Carbenicillin Geminisyn Percodan   Carna Arthritis Reliever Geopen Persantine   Carprofen Gold's salt Persistin   Chloramphenicol Goody's Phenylbutazone   Chloromycetin Haltrain Piroxlcam   Clmetidine  heparin Plaquenil   Cllnoril Hyco-pap Ponstel   Clofibrate Hydroxy chloroquine Propoxyphen         Before stopping any of these medications, be sure to consult the physician who ordered them.  Some, such as Coumadin (Warfarin) are ordered to prevent or treat serious conditions such as "deep thrombosis", "pumonary embolisms", and other heart problems.  The amount of time that you may need off of the medication may also vary with the medication and the reason for which you were taking it.  If you are taking any of these medications, please make sure you notify your pain physician before you undergo any procedures.

## 2018-06-02 ENCOUNTER — Ambulatory Visit (HOSPITAL_BASED_OUTPATIENT_CLINIC_OR_DEPARTMENT_OTHER)
Payer: Federal, State, Local not specified - PPO | Admitting: Student in an Organized Health Care Education/Training Program

## 2018-06-02 ENCOUNTER — Encounter: Payer: Self-pay | Admitting: Student in an Organized Health Care Education/Training Program

## 2018-06-02 ENCOUNTER — Ambulatory Visit
Admission: RE | Admit: 2018-06-02 | Discharge: 2018-06-02 | Disposition: A | Discharge status: Home / Self Care | Payer: Federal, State, Local not specified - PPO | Source: Ambulatory Visit | Attending: Student in an Organized Health Care Education/Training Program | Admitting: Student in an Organized Health Care Education/Training Program

## 2018-06-02 ENCOUNTER — Other Ambulatory Visit: Payer: Self-pay

## 2018-06-02 VITALS — BP 162/84 | HR 75 | Temp 98.0°F | Resp 18 | Ht 63.75 in | Wt 200.0 lb

## 2018-06-02 DIAGNOSIS — M1712 Unilateral primary osteoarthritis, left knee: Secondary | ICD-10-CM | POA: Insufficient documentation

## 2018-06-02 MED ORDER — DEXAMETHASONE SODIUM PHOSPHATE 10 MG/ML IJ SOLN
INTRAMUSCULAR | Status: AC
  Filled 2018-06-02: qty 1

## 2018-06-02 MED ORDER — LIDOCAINE HCL 2 % IJ SOLN
INTRAMUSCULAR | Status: AC
  Filled 2018-06-02: qty 20

## 2018-06-02 MED ORDER — DEXAMETHASONE SODIUM PHOSPHATE 10 MG/ML IJ SOLN
10.0000 mg | Freq: Once | INTRAMUSCULAR | Status: AC
  Administered 2018-06-02: 10 mg

## 2018-06-02 MED ORDER — ROPIVACAINE HCL 2 MG/ML IJ SOLN
10.0000 mL | Freq: Once | INTRAMUSCULAR | Status: AC
  Administered 2018-06-02: 10 mL

## 2018-06-02 MED ORDER — LIDOCAINE HCL 2 % IJ SOLN
20.0000 mL | Freq: Once | INTRAMUSCULAR | Status: AC
  Administered 2018-06-02: 400 mg

## 2018-06-02 MED ORDER — ROPIVACAINE HCL 2 MG/ML IJ SOLN
INTRAMUSCULAR | Status: AC
  Filled 2018-06-02: qty 10

## 2018-06-02 NOTE — Patient Instructions (Signed)

## 2018-06-02 NOTE — Progress Notes (Signed)
Safety precautions to be maintained throughout the outpatient stay will include: orient to surroundings, keep bed in low position, maintain call bell within reach at all times, provide assistance with transfer out of bed and ambulation.  

## 2018-06-02 NOTE — Progress Notes (Signed)
Patient's Name: Meredith Caldwell  MRN: [id number]  Referring Provider: Baxter Hire, MD  DOB: May 20, 1945  PCP: Baxter Hire, MD  DOS: 06/02/2018  Note by: Gillis Santa, MD  Service setting: Ambulatory outpatient  Specialty: Interventional Pain Management  Patient type: Established  Location: [facility] (AMB) Pain Management Facility  Visit type: Interventional Procedure   Primary Reason for Visit: Interventional Pain Management Treatment. CC: Knee Pain (bilateral, left is worse)  Procedure:          Anesthesia, Analgesia, Anxiolysis:  Type: Genicular Nerves Block (Superior-lateral, Superior-medial, and Inferior-medial Genicular Nerves) #1  CPT: 27741      Primary Purpose: Diagnostic Region: Lateral, Anterior, and Medial aspects of the knee joint, above and below the knee joint proper. Level: Superior and inferior to the knee joint. Target Area: For Genicular Nerve block(s), the targets are: the superior-lateral genicular nerve, located in the lateral distal portion of the femoral shaft as it curves to form the lateral epicondyle, in the region of the distal femoral metaphysis; the superior-medial genicular nerve, located in the medial distal portion of the femoral shaft as it curves to form the medial epicondyle; and the inferior-medial genicular nerve, located in the medial, proximal portion of the tibial shaft, as it curves to form the medial epicondyle, in the region of the proximal tibial metaphysis. Approach: Anterior, percutaneous, ipsilateral approach. Laterality: Left knee  Type: Local Anesthesia Indication(s): Analgesia         Route: Infiltration ([place]/IM) IV Access: Declined Sedation: Declined  Local Anesthetic: Lidocaine 1-2%  Position: Modified Fowler's position with pillows under the targeted knee(s).   Indications: 1. Arthritis of left knee    Pain Score: Pre-procedure: 6 /10 Post-procedure: 6 /10  Pre-op Assessment:  Meredith Caldwell is a 73 y.o. (year old), female  patient, seen today for interventional treatment. She  has a past surgical history that includes Tonsillectomy and adenoidectomy (1950); Appendectomy (1960); Cholecystectomy (1972); Abdominal hysterectomy (1991); Breast surgery (1980); Excision/release bursa hip (Left, 1997); Liver biopsy (2005); Melanoma excision (Left, 2008); and Knee surgery (Right, 2013). Meredith Caldwell has a current medication list which includes the following prescription(s): albuterol, aspirin ec, cetirizine, colesevelam, diclofenac, fish oil-krill oil, furosemide, losartan, metformin, montelukast, multivitamin with minerals, nabumetone, paroxetine, penciclovir, zolpidem, and naproxen sodium. Her primarily concern today is the Knee Pain (bilateral, left is worse)  Initial Vital Signs:  Pulse/HCG Rate: 87  Temp: 98 F (36.7 C) Resp: 16 BP: (!) 142/61 SpO2: 95 %  BMI: Estimated body mass index is 34.6 kg/m as calculated from the following:   Height as of this encounter: 5' 3.75" (1.619 m).   Weight as of this encounter: 200 lb (90.7 kg).  Risk Assessment: Allergies: Reviewed. She is allergic to statins; theophyllines; and zetia [ezetimibe].  Allergy Precautions: None required Coagulopathies: Reviewed. None identified.  Blood-thinner therapy: None at this time Active Infection(s): Reviewed. None identified. Meredith Caldwell is afebrile  Site Confirmation: Meredith Caldwell was asked to confirm the procedure and laterality before marking the site Procedure checklist: Completed Consent: Before the procedure and under the influence of no sedative(s), amnesic(s), or anxiolytics, the patient was informed of the treatment options, risks and possible complications. To fulfill our ethical and legal obligations, as recommended by the American Medical Association's Code of Ethics, I have informed the patient of my clinical impression; the nature and purpose of the treatment or procedure; the risks, benefits, and possible complications  of the intervention; the alternatives, including doing nothing; the risk(s) and benefit(s) of the  alternative treatment(s) or procedure(s); and the risk(s) and benefit(s) of doing nothing. The patient was provided information about the general risks and possible complications associated with the procedure. These may include, but are not limited to: failure to achieve desired goals, infection, bleeding, organ or nerve damage, allergic reactions, paralysis, and death. In addition, the patient was informed of those risks and complications associated to the procedure, such as failure to decrease pain; infection; bleeding; organ or nerve damage with subsequent damage to sensory, motor, and/or autonomic systems, resulting in permanent pain, numbness, and/or weakness of one or several areas of the body; allergic reactions; (i.e.: anaphylactic reaction); and/or death. Furthermore, the patient was informed of those risks and complications associated with the medications. These include, but are not limited to: allergic reactions (i.e.: anaphylactic or anaphylactoid reaction(s)); adrenal axis suppression; blood sugar elevation that in diabetics may result in ketoacidosis or comma; water retention that in patients with history of congestive heart failure may result in shortness of breath, pulmonary edema, and decompensation with resultant heart failure; weight gain; swelling or edema; medication-induced neural toxicity; particulate matter embolism and blood vessel occlusion with resultant organ, and/or nervous system infarction; and/or aseptic necrosis of one or more joints. Finally, the patient was informed that Medicine is not an exact science; therefore, there is also the possibility of unforeseen or unpredictable risks and/or possible complications that may result in a catastrophic outcome. The patient indicated having understood very clearly. We have given the patient no guarantees and we have made no promises. Enough  time was given to the patient to ask questions, all of which were answered to the patient's satisfaction. Meredith Caldwell has indicated that she wanted to continue with the procedure. Attestation: I, the ordering provider, attest that I have discussed with the patient the benefits, risks, side-effects, alternatives, likelihood of achieving goals, and potential problems during recovery for the procedure that I have provided informed consent. Date  Time: 06/02/2018 11:18 AM  Pre-Procedure Preparation:  Monitoring: As per clinic protocol. Respiration, ETCO2, SpO2, BP, heart rate and rhythm monitor placed and checked for adequate function Safety Precautions: Patient was assessed for positional comfort and pressure points before starting the procedure. Time-out: I initiated and conducted the "Time-out" before starting the procedure, as per protocol. The patient was asked to participate by confirming the accuracy of the "Time Out" information. Verification of the correct person, site, and procedure were performed and confirmed by me, the nursing staff, and the patient. "Time-out" conducted as per Joint Commission's Universal Protocol (UP.01.01.01). Time: 1203  Description of Procedure:          Area Prepped: Entire knee area, from mid-thigh to mid-shin, lateral, anterior, and medial aspects. Prepping solution: ChloraPrep (2% chlorhexidine gluconate and 70% isopropyl alcohol) Safety Precautions: Aspiration looking for blood return was conducted prior to all injections. At no point did we inject any substances, as a needle was being advanced. No attempts were made at seeking any paresthesias. Safe injection practices and needle disposal techniques used. Medications properly checked for expiration dates. SDV (single dose vial) medications used. Description of the Procedure: Protocol guidelines were followed. The patient was placed in position over the procedure table. The target area was identified and the area  prepped in the usual manner. Skin & deeper tissues infiltrated with local anesthetic. Appropriate amount of time allowed to pass for local anesthetics to take effect. The procedure needles were then advanced to the target area. Proper needle placement secured. Negative aspiration confirmed. Solution injected in intermittent  fashion, asking for systemic symptoms every 0.5cc of injectate. The needles were then removed and the area cleansed, making sure to leave some of the prepping solution back to take advantage of its long term bactericidal properties.  Vitals:   06/02/18 1126 06/02/18 1200 06/02/18 1205 06/02/18 1215  BP: (!) 142/61 (!) 151/77 (!) 160/83 (!) 162/84  Pulse: 87 67 75 75  Resp: 16 20 19 18   Temp: 98 F (36.7 C)     TempSrc: Oral     SpO2: 95% 96% 95% 96%  Weight: 200 lb (90.7 kg)     Height: 5' 3.75" (1.619 m)       Start Time: 1203 hrs. End Time: 1210 hrs. Materials:  Needle(s) Type: Spinal Needle Gauge: 25G Length: 3.5-in Medication(s): Please see orders for medications and dosing details. 5 cc solution consisting of 4 cc of 0.2% Marcaine, 1 cc of Decadron 10 mg/cc.  1.5 cc injected at each level above. Imaging Guidance (Non-Spinal):          Type of Imaging Technique: Fluoroscopy Guidance (Non-Spinal) Indication(s): Assistance in needle guidance and placement for procedures requiring needle placement in or near specific anatomical locations not easily accessible without such assistance. Exposure Time: Please see nurses notes. Contrast: None used. Fluoroscopic Guidance: I was personally present during the use of fluoroscopy. "Tunnel Vision Technique" used to obtain the best possible view of the target area. Parallax error corrected before commencing the procedure. "Direction-depth-direction" technique used to introduce the needle under continuous pulsed fluoroscopy. Once target was reached, antero-posterior, oblique, and lateral fluoroscopic projection used confirm needle  placement in all planes. Images permanently stored in EMR. Interpretation: No contrast injected. I personally interpreted the imaging intraoperatively. Adequate needle placement confirmed in multiple planes. Permanent images saved into the patient's record.  Antibiotic Prophylaxis:   Anti-infectives (From admission, onward)   None     Indication(s): None identified  Post-operative Assessment:  Post-procedure Vital Signs:  Pulse/HCG Rate: 75  Temp: 98 F (36.7 C) Resp: 18 BP: (!) 162/84 SpO2: 96 %  EBL: None  Complications: No immediate post-treatment complications observed by team, or reported by patient.  Note: The patient tolerated the entire procedure well. A repeat set of vitals were taken after the procedure and the patient was kept under observation following institutional policy, for this type of procedure. Post-procedural neurological assessment was performed, showing return to baseline, prior to discharge. The patient was provided with post-procedure discharge instructions, including a section on how to identify potential problems. Should any problems arise concerning this procedure, the patient was given instructions to immediately contact us, at any time, without hesitation. In any case, we plan to contact the patient by telephone for a follow-up status report regarding this interventional procedure.  Comments:  No additional relevant information.  Plan of Care   Imaging Orders     DG C-Arm 1-60 Min-No Report Procedure Orders    No procedure(s) ordered today    Medications ordered for procedure: Meds ordered this encounter  Medications  . ropivacaine (PF) 2 mg/mL (0.2%) (NAROPIN) injection 10 mL  . lidocaine (XYLOCAINE) 2 % (with pres) injection 400 mg  . dexamethasone (DECADRON) injection 10 mg   Medications administered: We administered ropivacaine (PF) 2 mg/mL (0.2%), lidocaine, and dexamethasone.  See the medical record for exact dosing, route, and time of  administration.  Disposition: Discharge home  Discharge Date & Time: 06/02/2018; 1220 hrs.   Physician-requested Follow-up: Return in about 4 weeks (around 06/30/2018) for Post Procedure Evaluation.  Future Appointments  Date Time Provider [place]  06/30/2018  1:45 PM Gillis Santa, MD [facility] None   Primary Care Physician: Baxter Hire, MD Location: [facility] Outpatient Pain Management Facility Note by: Gillis Santa, MD Date: 06/02/2018; Time: 1:54 PM  Disclaimer:  Medicine is not an exact science. The only guarantee in medicine is that nothing is guaranteed. It is important to note that the decision to proceed with this intervention was based on the information collected from the patient. The Data and conclusions were drawn from the patient's questionnaire, the interview, and the physical examination. Because the information was provided in large part by the patient, it cannot be guaranteed that it has not been purposely or unconsciously manipulated. Every effort has been made to obtain as much relevant data as possible for this evaluation. It is important to note that the conclusions that lead to this procedure are derived in large part from the available data. Always take into account that the treatment will also be dependent on availability of resources and existing treatment guidelines, considered by other Pain Management Practitioners as being common knowledge and practice, at the time of the intervention. For Medico-Legal purposes, it is also important to point out that variation in procedural techniques and pharmacological choices are the acceptable norm. The indications, contraindications, technique, and results of the above procedure should only be interpreted and judged by a Board-Certified Interventional Pain Specialist with extensive familiarity and expertise in the same exact procedure and technique.

## 2018-06-03 ENCOUNTER — Telehealth: Payer: Self-pay | Admitting: *Deleted

## 2018-06-03 NOTE — Telephone Encounter (Signed)
No problems post procedure. 

## 2018-06-30 ENCOUNTER — Encounter: Payer: Self-pay | Admitting: Student in an Organized Health Care Education/Training Program

## 2018-06-30 ENCOUNTER — Other Ambulatory Visit: Payer: Self-pay

## 2018-06-30 ENCOUNTER — Ambulatory Visit
Payer: Federal, State, Local not specified - PPO | Attending: Student in an Organized Health Care Education/Training Program | Admitting: Student in an Organized Health Care Education/Training Program

## 2018-06-30 VITALS — BP 155/83 | HR 79 | Temp 97.7°F | Resp 18 | Ht 63.75 in | Wt 200.0 lb

## 2018-06-30 DIAGNOSIS — M25562 Pain in left knee: Secondary | ICD-10-CM | POA: Diagnosis not present

## 2018-06-30 DIAGNOSIS — G8929 Other chronic pain: Secondary | ICD-10-CM | POA: Diagnosis present

## 2018-06-30 DIAGNOSIS — M1711 Unilateral primary osteoarthritis, right knee: Secondary | ICD-10-CM

## 2018-06-30 DIAGNOSIS — M1712 Unilateral primary osteoarthritis, left knee: Secondary | ICD-10-CM | POA: Diagnosis present

## 2018-06-30 NOTE — Patient Instructions (Signed)
GENERAL RISKS AND COMPLICATIONS  What are the risk, side effects and possible complications? Generally speaking, most procedures are safe.  However, with any procedure there are risks, side effects, and the possibility of complications.  The risks and complications are dependent upon the sites that are lesioned, or the type of nerve block to be performed.  The closer the procedure is to the spine, the more serious the risks are.  Great care is taken when placing the radio frequency needles, block needles or lesioning probes, but sometimes complications can occur. 1. Infection: Any time there is an injection through the skin, there is a risk of infection.  This is why sterile conditions are used for these blocks.  There are four possible types of infection. 1. Localized skin infection. 2. Central Nervous System Infection-This can be in the form of Meningitis, which can be deadly. 3. Epidural Infections-This can be in the form of an epidural abscess, which can cause pressure inside of the spine, causing compression of the spinal cord with subsequent paralysis. This would require an emergency surgery to decompress, and there are no guarantees that the patient would recover from the paralysis. 4. Discitis-This is an infection of the intervertebral discs.  It occurs in about 1% of discography procedures.  It is difficult to treat and it may lead to surgery.        2. Pain: the needles have to go through skin and soft tissues, will cause soreness.       3. Damage to internal structures:  The nerves to be lesioned may be near blood vessels or    other nerves which can be potentially damaged.       4. Bleeding: Bleeding is more common if the patient is taking blood thinners such as  aspirin, Coumadin, Ticiid, Plavix, etc., or if he/she have some genetic predisposition  such as hemophilia. Bleeding into the spinal canal can cause compression of the spinal  cord with subsequent paralysis.  This would require an  emergency surgery to  decompress and there are no guarantees that the patient would recover from the  paralysis.       5. Pneumothorax:  Puncturing of a lung is a possibility, every time a needle is introduced in  the area of the chest or upper back.  Pneumothorax refers to free air around the  collapsed lung(s), inside of the thoracic cavity (chest cavity).  Another two possible  complications related to a similar event would include: Hemothorax and Chylothorax.   These are variations of the Pneumothorax, where instead of air around the collapsed  lung(s), you may have blood or chyle, respectively.       6. Spinal headaches: They may occur with any procedures in the area of the spine.       7. Persistent CSF (Cerebro-Spinal Fluid) leakage: This is a rare problem, but may occur  with prolonged intrathecal or epidural catheters either due to the formation of a fistulous  track or a dural tear.       8. Nerve damage: By working so close to the spinal cord, there is always a possibility of  nerve damage, which could be as serious as a permanent spinal cord injury with  paralysis.       9. Death:  Although rare, severe deadly allergic reactions known as "Anaphylactic  reaction" can occur to any of the medications used.      10. Worsening of the symptoms:  We can always make thing worse.    What are the chances of something like this happening? Chances of any of this occuring are extremely low.  By statistics, you have more of a chance of getting killed in a motor vehicle accident: while driving to the hospital than any of the above occurring .  Nevertheless, you should be aware that they are possibilities.  In general, it is similar to taking a shower.  Everybody knows that you can slip, hit your head and get killed.  Does that mean that you should not shower again?  Nevertheless always keep in mind that statistics do not mean anything if you happen to be on the wrong side of them.  Even if a procedure has a 1  (one) in a 1,000,000 (million) chance of going wrong, it you happen to be that one..Also, keep in mind that by statistics, you have more of a chance of having something go wrong when taking medications.  Who should not have this procedure? If you are on a blood thinning medication (e.g. Coumadin, Plavix, see list of "Blood Thinners"), or if you have an active infection going on, you should not have the procedure.  If you are taking any blood thinners, please inform your physician.  How should I prepare for this procedure?  Do not eat or drink anything at least six hours prior to the procedure.  Bring a driver with you .  It cannot be a taxi.  Come accompanied by an adult that can drive you back, and that is strong enough to help you if your legs get weak or numb from the local anesthetic.  Take all of your medicines the morning of the procedure with just enough water to swallow them.  If you have diabetes, make sure that you are scheduled to have your procedure done first thing in the morning, whenever possible.  If you have diabetes, take only half of your insulin dose and notify our nurse that you have done so as soon as you arrive at the clinic.  If you are diabetic, but only take blood sugar pills (oral hypoglycemic), then do not take them on the morning of your procedure.  You may take them after you have had the procedure.  Do not take aspirin or any aspirin-containing medications, at least eleven (11) days prior to the procedure.  They may prolong bleeding.  Wear loose fitting clothing that may be easy to take off and that you would not mind if it got stained with Betadine or blood.  Do not wear any jewelry or perfume  Remove any nail coloring.  It will interfere with some of our monitoring equipment.  NOTE: Remember that this is not meant to be interpreted as a complete list of all possible complications.  Unforeseen problems may occur.  BLOOD THINNERS The following drugs  contain aspirin or other products, which can cause increased bleeding during surgery and should not be taken for 2 weeks prior to and 1 week after surgery.  If you should need take something for relief of minor pain, you may take acetaminophen which is found in Tylenol,m Datril, Anacin-3 and Panadol. It is not blood thinner. The products listed below are.  Do not take any of the products listed below in addition to any listed on your instruction sheet.  A.P.C or A.P.C with Codeine Codeine Phosphate Capsules #3 Ibuprofen Ridaura  ABC compound Congesprin Imuran rimadil  Advil Cope Indocin Robaxisal  Alka-Seltzer Effervescent Pain Reliever and Antacid Coricidin or Coricidin-D  Indomethacin Rufen    Alka-Seltzer plus Cold Medicine Cosprin Ketoprofen S-A-C Tablets  Anacin Analgesic Tablets or Capsules Coumadin Korlgesic Salflex  Anacin Extra Strength Analgesic tablets or capsules CP-2 Tablets Lanoril Salicylate  Anaprox Cuprimine Capsules Levenox Salocol  Anexsia-D Dalteparin Magan Salsalate  Anodynos Darvon compound Magnesium Salicylate Sine-off  Ansaid Dasin Capsules Magsal Sodium Salicylate  Anturane Depen Capsules Marnal Soma  APF Arthritis pain formula Dewitt's Pills Measurin Stanback  Argesic Dia-Gesic Meclofenamic Sulfinpyrazone  Arthritis Bayer Timed Release Aspirin Diclofenac Meclomen Sulindac  Arthritis pain formula Anacin Dicumarol Medipren Supac  Analgesic (Safety coated) Arthralgen Diffunasal Mefanamic Suprofen  Arthritis Strength Bufferin Dihydrocodeine Mepro Compound Suprol  Arthropan liquid Dopirydamole Methcarbomol with Aspirin Synalgos  ASA tablets/Enseals Disalcid Micrainin Tagament  Ascriptin Doan's Midol Talwin  Ascriptin A/D Dolene Mobidin Tanderil  Ascriptin Extra Strength Dolobid Moblgesic Ticlid  Ascriptin with Codeine Doloprin or Doloprin with Codeine Momentum Tolectin  Asperbuf Duoprin Mono-gesic Trendar  Aspergum Duradyne Motrin or Motrin IB Triminicin  Aspirin  plain, buffered or enteric coated Durasal Myochrisine Trigesic  Aspirin Suppositories Easprin Nalfon Trillsate  Aspirin with Codeine Ecotrin Regular or Extra Strength Naprosyn Uracel  Atromid-S Efficin Naproxen Ursinus  Auranofin Capsules Elmiron Neocylate Vanquish  Axotal Emagrin Norgesic Verin  Azathioprine Empirin or Empirin with Codeine Normiflo Vitamin E  Azolid Emprazil Nuprin Voltaren  Bayer Aspirin plain, buffered or children's or timed BC Tablets or powders Encaprin Orgaran Warfarin Sodium  Buff-a-Comp Enoxaparin Orudis Zorpin  Buff-a-Comp with Codeine Equegesic Os-Cal-Gesic   Buffaprin Excedrin plain, buffered or Extra Strength Oxalid   Bufferin Arthritis Strength Feldene Oxphenbutazone   Bufferin plain or Extra Strength Feldene Capsules Oxycodone with Aspirin   Bufferin with Codeine Fenoprofen Fenoprofen Pabalate or Pabalate-SF   Buffets II Flogesic Panagesic   Buffinol plain or Extra Strength Florinal or Florinal with Codeine Panwarfarin   Buf-Tabs Flurbiprofen Penicillamine   Butalbital Compound Four-way cold tablets Penicillin   Butazolidin Fragmin Pepto-Bismol   Carbenicillin Geminisyn Percodan   Carna Arthritis Reliever Geopen Persantine   Carprofen Gold's salt Persistin   Chloramphenicol Goody's Phenylbutazone   Chloromycetin Haltrain Piroxlcam   Clmetidine heparin Plaquenil   Cllnoril Hyco-pap Ponstel   Clofibrate Hydroxy chloroquine Propoxyphen         Before stopping any of these medications, be sure to consult the physician who ordered them.  Some, such as Coumadin (Warfarin) are ordered to prevent or treat serious conditions such as "deep thrombosis", "pumonary embolisms", and other heart problems.  The amount of time that you may need off of the medication may also vary with the medication and the reason for which you were taking it.  If you are taking any of these medications, please make sure you notify your pain physician before you undergo any  procedures.   Moderate Conscious Sedation, Adult Sedation is the use of medicines to promote relaxation and relieve discomfort and anxiety. Moderate conscious sedation is a type of sedation. Under moderate conscious sedation, you are less alert than normal, but you are still able to respond to instructions, touch, or both. Moderate conscious sedation is used during short medical and dental procedures. It is milder than deep sedation, which is a type of sedation under which you cannot be easily woken up. It is also milder than general anesthesia, which is the use of medicines to make you unconscious. Moderate conscious sedation allows you to return to your regular activities sooner. Tell a health care provider about:  Any allergies you have.  All medicines you are taking, including vitamins, herbs, eye drops,  creams, and over-the-counter medicines.  Use of steroids (by mouth or creams).  Any problems you or family members have had with sedatives and anesthetic medicines.  Any blood disorders you have.  Any surgeries you have had.  Any medical conditions you have, such as sleep apnea.  Whether you are pregnant or may be pregnant.  Any use of cigarettes, alcohol, marijuana, or street drugs. What are the risks? Generally, this is a safe procedure. However, problems may occur, including:  Getting too much medicine (oversedation).  Nausea.  Allergic reaction to medicines.  Trouble breathing. If this happens, a breathing tube may be used to help with breathing. It will be removed when you are awake and breathing on your own.  Heart trouble.  Lung trouble. What happens before the procedure? Staying hydrated Follow instructions from your health care provider about hydration, which may include:  Up to 2 hours before the procedure - you may continue to drink clear liquids, such as water, clear fruit juice, black coffee, and plain tea. Eating and drinking restrictions Follow  instructions from your health care provider about eating and drinking, which may include:  8 hours before the procedure - stop eating heavy meals or foods such as meat, fried foods, or fatty foods.  6 hours before the procedure - stop eating light meals or foods, such as toast or cereal.  6 hours before the procedure - stop drinking milk or drinks that contain milk.  2 hours before the procedure - stop drinking clear liquids. Medicine Ask your health care provider about:  Changing or stopping your regular medicines. This is especially important if you are taking diabetes medicines or blood thinners.  Taking medicines such as aspirin and ibuprofen. These medicines can thin your blood. Do not take these medicines before your procedure if your health care provider instructs you not to.  Tests and exams  You will have a physical exam.  You may have blood tests done to show: ? How well your kidneys and liver are working. ? How well your blood can clot. General instructions  Plan to have someone take you home from the hospital or clinic.  If you will be going home right after the procedure, plan to have someone with you for 24 hours. What happens during the procedure?  An IV tube will be inserted into one of your veins.  Medicine to help you relax (sedative) will be given through the IV tube.  The medical or dental procedure will be performed. What happens after the procedure?  Your blood pressure, heart rate, breathing rate, and blood oxygen level will be monitored often until the medicines you were given have worn off.  Do not drive for 24 hours. This information is not intended to replace advice given to you by your health care provider. Make sure you discuss any questions you have with your health care provider. Document Released: 01/23/2001 Document Revised: 10/04/2015 Document Reviewed: 08/20/2015 [employer] Interactive Patient Education  2019 [employer].

## 2018-06-30 NOTE — Progress Notes (Signed)
Safety precautions to be maintained throughout the outpatient stay will include: orient to surroundings, keep bed in low position, maintain call bell within reach at all times, provide assistance with transfer out of bed and ambulation.  

## 2018-06-30 NOTE — Progress Notes (Signed)
Patient's Name: Meredith Caldwell  MRN: [id number]  Referring Provider: Baxter Hire, MD  DOB: 10-30-1945  PCP: Baxter Hire, MD  DOS: 06/30/2018  Note by: Gillis Santa, MD  Service setting: Ambulatory outpatient  Specialty: Interventional Pain Management  Location: [facility] (AMB) Pain Management Facility    Patient type: Established   Primary Reason(s) for Visit: Encounter for post-procedure evaluation of chronic illness with mild to moderate exacerbation CC: Knee Pain (bilateral)  HPI  Meredith Caldwell is a 73 y.o. year old, female patient, who comes today for a post-procedure evaluation. She has Primary osteoarthritis of right knee; Obesity; Diabetes mellitus type 2, uncomplicated ([place]); Chronic pain of left knee; Chronic pain syndrome; and Arthritis of left knee on their problem list. Her primarily concern today is the Knee Pain (bilateral)  Pain Assessment: Location: Right, Left Knee Radiating: sometimes to calves, bilat Onset: More than a month ago Duration: Chronic pain Quality: Throbbing Severity: 2 /10 (subjective, self-reported pain score)  Note: Reported level is compatible with observation.                         When using our objective Pain Scale, levels between 6 and 10/10 are said to belong in an emergency room, as it progressively worsens from a 6/10, described as severely limiting, requiring emergency care not usually available at an outpatient pain management facility. At a 6/10 level, communication becomes difficult and requires great effort. Assistance to reach the emergency department may be required. Facial flushing and profuse sweating along with potentially dangerous increases in heart rate and blood pressure will be evident. Effect on ADL: limits daily activity Timing: Intermittent("dependson level of activity, how long on feet") Modifying factors: sitting, procedure, elevation BP: (!) 155/83  HR: 79  Meredith Caldwell comes in today for post-procedure  evaluation.  Further details on both, my assessment(s), as well as the proposed treatment plan, please see below.  Post-Procedure Assessment  06/02/2018 Procedure: Left knee genicular nerve block Pre-procedure pain score:  6/10 Post-procedure pain score: 6/10         Influential Factors: BMI: 34.60 kg/m Intra-procedural challenges: None observed.         Assessment challenges: None detected.              Reported side-effects: None.        Post-procedural adverse reactions or complications: None reported         Sedation: Please see nurses note. When no sedatives are used, the analgesic levels obtained are directly associated to the effectiveness of the local anesthetics. However, when sedation is provided, the level of analgesia obtained during the initial 1 hour following the intervention, is believed to be the result of a combination of factors. These factors may include, but are not limited to: 1. The effectiveness of the local anesthetics used. 2. The effects of the analgesic(s) and/or anxiolytic(s) used. 3. The degree of discomfort experienced by the patient at the time of the procedure. 4. The patients ability and reliability in recalling and recording the events. 5. The presence and influence of possible secondary gains and/or psychosocial factors. Reported result: Relief experienced during the 1st hour after the procedure: 90 % (Ultra-Short Term Relief)            Interpretative annotation: Clinically appropriate result. Analgesia during this period is likely to be Local Anesthetic and/or IV Sedative (Analgesic/Anxiolytic) related.          Effects of local anesthetic: The analgesic  effects attained during this period are directly associated to the localized infiltration of local anesthetics and therefore cary significant diagnostic value as to the etiological location, or anatomical origin, of the pain. Expected duration of relief is directly dependent on the pharmacodynamics of the  local anesthetic used. Long-acting (4-6 hours) anesthetics used.  Reported result: Relief during the next 4 to 6 hour after the procedure: 90 % (Short-Term Relief)            Interpretative annotation: Clinically appropriate result. Analgesia during this period is likely to be Local Anesthetic-related.          Long-term benefit: Defined as the period of time past the expected duration of local anesthetics (1 hour for short-acting and 4-6 hours for long-acting). With the possible exception of prolonged sympathetic blockade from the local anesthetics, benefits during this period are typically attributed to, or associated with, other factors such as analgesic sensory neuropraxia, antiinflammatory effects, or beneficial biochemical changes provided by agents other than the local anesthetics.  Reported result: Extended relief following procedure: 90 %("some relief of Left knee pain, continues to feel throbbing in left calf, left side of left knee is still uncomfortable; i feel like I had better results on right side; generally speaking I am better than I was before") (Long-Term Relief)            Interpretative annotation: Clinically possible results. Good relief. No permanent benefit expected. Inflammation plays a part in the etiology to the pain.          Current benefits: Defined as reported results that persistent at this point in time.   Analgesia: 50-75 %            Function: Somewhat improved ROM: Somewhat improved Interpretative annotation: Recurrence of symptoms. No permanent benefit expected. Effective diagnostic intervention.          Interpretation: Results would suggest a successful diagnostic intervention.                  Plan:  Proceed with Radiofrequency Ablation for the purpose of attaining long-term benefits.                Meds   Current Outpatient Medications:  .  albuterol (PROVENTIL HFA;VENTOLIN HFA) 108 (90 Base) MCG/ACT inhaler, Inhale into the lungs., Disp: , Rfl:  .   aspirin EC 81 MG tablet, Take by mouth., Disp: , Rfl:  .  cetirizine (ZYRTEC) 10 MG tablet, Take 10 mg by mouth daily., Disp: , Rfl:  .  colesevelam (WELCHOL) 625 MG tablet, Take by mouth., Disp: , Rfl:  .  diclofenac (VOLTAREN) 75 MG EC tablet, Take 1 tablet (75 mg total) by mouth daily for 90 doses., Disp: 30 tablet, Rfl: 2 .  FISH OIL-KRILL OIL PO, Take 360 mg by mouth., Disp: , Rfl:  .  furosemide (LASIX) 40 MG tablet, Take by mouth., Disp: , Rfl:  .  losartan (COZAAR) 50 MG tablet, take 1 tablet by mouth once daily, Disp: , Rfl:  .  metFORMIN (GLUCOPHAGE) 1000 MG tablet, Take 1,000 mg by mouth 2 (two) times daily with a meal., Disp: , Rfl:  .  montelukast (SINGULAIR) 10 MG tablet, Take 10 mg by mouth at bedtime., Disp: , Rfl:  .  Multiple Vitamin (MULTIVITAMIN WITH MINERALS) TABS tablet, Take 1 tablet by mouth daily., Disp: , Rfl:  .  nabumetone (RELAFEN) 500 MG tablet, Take 500 mg by mouth daily., Disp: , Rfl:  .  naproxen sodium (ALEVE)  220 MG tablet, Take 220 mg by mouth daily as needed. , Disp: , Rfl:  .  PAROXETINE HCL ER PO, Take 5 mg by mouth daily. , Disp: , Rfl:  .  penciclovir (DENAVIR) 1 % cream, Apply 1 application topically every 2 (two) hours., Disp: , Rfl:  .  zolpidem (AMBIEN) 5 MG tablet, Take 5 mg by mouth at bedtime as needed for sleep., Disp: , Rfl:   ROS  Constitutional: Denies any fever or chills Gastrointestinal: No reported hemesis, hematochezia, vomiting, or acute GI distress Musculoskeletal: Denies any acute onset joint swelling, redness, loss of ROM, or weakness Neurological: No reported episodes of acute onset apraxia, aphasia, dysarthria, agnosia, amnesia, paralysis, loss of coordination, or loss of consciousness  Allergies  Meredith Caldwell is allergic to statins; theophyllines; and zetia [ezetimibe].  [place]  Drug: Meredith Caldwell  reports no history of drug use. Alcohol:  reports current alcohol use. Tobacco:  reports that she quit smoking about 49 years  ago. She has never used smokeless tobacco. Medical:  has a past medical history of Bone spur (2000), Diabetes ([place]) (2005), Edema (2000), Melanoma ([place]) (2008), Neuroma (2006), and Vaso vagal episode (1992). Surgical: Meredith Caldwell  has a past surgical history that includes Tonsillectomy and adenoidectomy (1950); Appendectomy (1960); Cholecystectomy (1972); Abdominal hysterectomy (1991); Breast surgery (1980); Excision/release bursa hip (Left, 1997); Liver biopsy (2005); Melanoma excision (Left, 2008); and Knee surgery (Right, 2013). Family: family history is not on file.  Constitutional Exam  General appearance: Well nourished, well developed, and well hydrated. In no apparent acute distress Vitals:   06/30/18 1329  BP: (!) 155/83  Pulse: 79  Resp: 18  Temp: 97.7 F (36.5 C)  TempSrc: Oral  SpO2: 97%  Weight: 200 lb (90.7 kg)  Height: 5' 3.75" (1.619 m)   BMI Assessment: Estimated body mass index is 34.6 kg/m as calculated from the following:   Height as of this encounter: 5' 3.75" (1.619 m).   Weight as of this encounter: 200 lb (90.7 kg).  BMI interpretation table: BMI level Category Range association with higher incidence of chronic pain  <18 kg/m2 Underweight   18.5-24.9 kg/m2 Ideal body weight   25-29.9 kg/m2 Overweight Increased incidence by 20%  30-34.9 kg/m2 Obese (Class I) Increased incidence by 68%  35-39.9 kg/m2 Severe obesity (Class II) Increased incidence by 136%  >40 kg/m2 Extreme obesity (Class III) Increased incidence by 254%   Patient's current BMI Ideal Body weight  Body mass index is 34.6 kg/m. Ideal body weight: 54.1 kg (119 lb 5.2 oz) Adjusted ideal body weight: 68.8 kg (151 lb 9.5 oz)   BMI Readings from Last 4 Encounters:  06/30/18 34.60 kg/m  06/02/18 34.60 kg/m  05/22/18 34.60 kg/m  04/23/18 34.60 kg/m   Wt Readings from Last 4 Encounters:  06/30/18 200 lb (90.7 kg)  06/02/18 200 lb (90.7 kg)  05/22/18 200 lb (90.7 kg)  04/23/18 200 lb  (90.7 kg)  Psych/Mental status: Alert, oriented x 3 (person, place, & time)       Eyes: PERLA Respiratory: No evidence of acute respiratory distress  Cervical Spine Area Exam  Skin & Axial Inspection: No masses, redness, edema, swelling, or associated skin lesions Alignment: Symmetrical Functional ROM: Unrestricted ROM      Stability: No instability detected Muscle Tone/Strength: Functionally intact. No obvious neuro-muscular anomalies detected. Sensory (Neurological): Unimpaired Palpation: No palpable anomalies                Lumbar Spine Area Exam  Skin & Axial Inspection: No masses, redness, or swelling Alignment: Symmetrical Functional ROM: Unrestricted ROM       Stability: No instability detected Muscle Tone/Strength: Functionally intact. No obvious neuro-muscular anomalies detected. Sensory (Neurological): Unimpaired Palpation: No palpable anomalies       Provocative Tests: Hyperextension/rotation test: deferred today       Lumbar quadrant test (Kemp's test): deferred today       Lateral bending test: deferred today       Patrick's Maneuver: deferred today                   FABER* test: deferred today                   S-I anterior distraction/compression test: deferred today         S-I lateral compression test: deferred today         S-I Thigh-thrust test: deferred today         S-I Gaenslen's test: deferred today         *(Flexion, ABduction and External Rotation)  Gait & Posture Assessment  Ambulation: Unassisted Gait: Relatively normal for age and body habitus Posture: WNL   Lower Extremity Exam    Side: Right lower extremity  Side: Left lower extremity  Stability: No instability observed          Stability: No instability observed          Skin & Extremity Inspection: Skin color, temperature, and hair growth are WNL. No peripheral edema or cyanosis. No masses, redness, swelling, asymmetry, or associated skin lesions. No contractures.  Skin & Extremity  Inspection: Skin color, temperature, and hair growth are WNL. No peripheral edema or cyanosis. No masses, redness, swelling, asymmetry, or associated skin lesions. No contractures.  Functional ROM: Unrestricted ROM                  Functional ROM: Pain restricted ROM for hip and knee joints          Muscle Tone/Strength: Functionally intact. No obvious neuro-muscular anomalies detected.  Muscle Tone/Strength: Functionally intact. No obvious neuro-muscular anomalies detected.  Sensory (Neurological): Arthropathic arthralgia        Sensory (Neurological): Arthropathic arthralgia        DTR: Patellar: deferred today Achilles: deferred today Plantar: deferred today  DTR: Patellar: deferred today Achilles: deferred today Plantar: deferred today  Palpation: No palpable anomalies  Palpation: No palpable anomalies   Assessment   Status Diagnosis  Persistent Persistent Responding 1. Arthritis of left knee   2. Primary osteoarthritis of right knee   3. Chronic pain of left knee       Patient is a very pleasant 73 year old female who follows up status post left knee genicular nerve block #1.  Patient endorses moderate pain relief after her left knee genicular nerve block.  She states that her right knee results after her right knee genicular nerve block were better.  She is still pleased with her right knee pain status post right knee genicular nerve block on 04/23/2018.  In regards to her left knee pain, we discussed radiofrequency ablation.  Risks and benefits of this procedure were discussed in detail.  Patient would like to proceed.  Plan: -Left genicular nerve RFA, radiofrequency ablation  Future considerations: -Right genicular nerve RFA  Lab-work, procedure(s), and/or referral(s): Orders Placed This Encounter  Procedures  . Radiofrequency,Genicular   Time Note: Greater than 50% of the 25 minute(s) of face-to-face time spent with Ms.  Caldwell, was spent in counseling/coordination  of care regarding: Meredith Caldwell primary cause of pain, the treatment plan, treatment alternatives, the risks and possible complications of proposed treatment, going over the informed consent, the results, interpretation and significance of  her recent diagnostic interventional treatment(s) and realistic expectations.  Provider-requested follow-up: Return for Procedure.  No future appointments.  Primary Care Physician: Baxter Hire, MD Location: [facility] Note by: Gillis Santa, M.D Date: 06/30/2018; Time: 3:06 PM  Patient Instructions   GENERAL RISKS AND COMPLICATIONS  What are the risk, side effects and possible complications? Generally speaking, most procedures are safe.  However, with any procedure there are risks, side effects, and the possibility of complications.  The risks and complications are dependent upon the sites that are lesioned, or the type of nerve block to be performed.  The closer the procedure is to the spine, the more serious the risks are.  Great care is taken when placing the radio frequency needles, block needles or lesioning probes, but sometimes complications can occur. 1. Infection: Any time there is an injection through the skin, there is a risk of infection.  This is why sterile conditions are used for these blocks.  There are four possible types of infection. 1. Localized skin infection. 2. Central Nervous System Infection-This can be in the form of Meningitis, which can be deadly. 3. Epidural Infections-This can be in the form of an epidural abscess, which can cause pressure inside of the spine, causing compression of the spinal cord with subsequent paralysis. This would require an emergency surgery to decompress, and there are no guarantees that the patient would recover from the paralysis. 4. Discitis-This is an infection of the intervertebral discs.  It occurs in about 1% of discography procedures.  It is difficult to treat  and it may lead to surgery.        2. Pain: the needles have to go through skin and soft tissues, will cause soreness.       3. Damage to internal structures:  The nerves to be lesioned may be near blood vessels or    other nerves which can be potentially damaged.       4. Bleeding: Bleeding is more common if the patient is taking blood thinners such as  aspirin, Coumadin, Ticiid, Plavix, etc., or if he/she have some genetic predisposition  such as hemophilia. Bleeding into the spinal canal can cause compression of the spinal  cord with subsequent paralysis.  This would require an emergency surgery to  decompress and there are no guarantees that the patient would recover from the  paralysis.       5. Pneumothorax:  Puncturing of a lung is a possibility, every time a needle is introduced in  the area of the chest or upper back.  Pneumothorax refers to free air around the  collapsed lung(s), inside of the thoracic cavity (chest cavity).  Another two possible  complications related to a similar event would include: Hemothorax and Chylothorax.   These are variations of the Pneumothorax, where instead of air around the collapsed  lung(s), you may have blood or chyle, respectively.       6. Spinal headaches: They may occur with any procedures in the area of the spine.       7. Persistent CSF (Cerebro-Spinal Fluid) leakage: This is a rare problem, but may occur  with prolonged intrathecal or epidural catheters either due to the formation of a fistulous  track or a  dural tear.       8. Nerve damage: By working so close to the spinal cord, there is always a possibility of  nerve damage, which could be as serious as a permanent spinal cord injury with  paralysis.       9. Death:  Although rare, severe deadly allergic reactions known as "Anaphylactic  reaction" can occur to any of the medications used.      10. Worsening of the symptoms:  We can always make thing worse.  What are the chances of something like this  happening? Chances of any of this occuring are extremely low.  By statistics, you have more of a chance of getting killed in a motor vehicle accident: while driving to the hospital than any of the above occurring .  Nevertheless, you should be aware that they are possibilities.  In general, it is similar to taking a shower.  Everybody knows that you can slip, hit your head and get killed.  Does that mean that you should not shower again?  Nevertheless always keep in mind that statistics do not mean anything if you happen to be on the wrong side of them.  Even if a procedure has a 1 (one) in a 1,000,000 (million) chance of going wrong, it you happen to be that one..Also, keep in mind that by statistics, you have more of a chance of having something go wrong when taking medications.  Who should not have this procedure? If you are on a blood thinning medication (e.g. Coumadin, Plavix, see list of "Blood Thinners"), or if you have an active infection going on, you should not have the procedure.  If you are taking any blood thinners, please inform your physician.  How should I prepare for this procedure?  Do not eat or drink anything at least six hours prior to the procedure.  Bring a driver with you .  It cannot be a taxi.  Come accompanied by an adult that can drive you back, and that is strong enough to help you if your legs get weak or numb from the local anesthetic.  Take all of your medicines the morning of the procedure with just enough water to swallow them.  If you have diabetes, make sure that you are scheduled to have your procedure done first thing in the morning, whenever possible.  If you have diabetes, take only half of your insulin dose and notify our nurse that you have done so as soon as you arrive at the clinic.  If you are diabetic, but only take blood sugar pills (oral hypoglycemic), then do not take them on the morning of your procedure.  You may take them after you have had the  procedure.  Do not take aspirin or any aspirin-containing medications, at least eleven (11) days prior to the procedure.  They may prolong bleeding.  Wear loose fitting clothing that may be easy to take off and that you would not mind if it got stained with Betadine or blood.  Do not wear any jewelry or perfume  Remove any nail coloring.  It will interfere with some of our monitoring equipment.  NOTE: Remember that this is not meant to be interpreted as a complete list of all possible complications.  Unforeseen problems may occur.  BLOOD THINNERS The following drugs contain aspirin or other products, which can cause increased bleeding during surgery and should not be taken for 2 weeks prior to and 1 week after surgery.  If you  should need take something for relief of minor pain, you may take acetaminophen which is found in Tylenol,m Datril, Anacin-3 and Panadol. It is not blood thinner. The products listed below are.  Do not take any of the products listed below in addition to any listed on your instruction sheet.  A.P.C or A.P.C with Codeine Codeine Phosphate Capsules #3 Ibuprofen Ridaura  ABC compound Congesprin Imuran rimadil  Advil Cope Indocin Robaxisal  Alka-Seltzer Effervescent Pain Reliever and Antacid Coricidin or Coricidin-D  Indomethacin Rufen  Alka-Seltzer plus Cold Medicine Cosprin Ketoprofen S-A-C Tablets  Anacin Analgesic Tablets or Capsules Coumadin Korlgesic Salflex  Anacin Extra Strength Analgesic tablets or capsules CP-2 Tablets Lanoril Salicylate  Anaprox Cuprimine Capsules Levenox Salocol  Anexsia-D Dalteparin Magan Salsalate  Anodynos Darvon compound Magnesium Salicylate Sine-off  Ansaid Dasin Capsules Magsal Sodium Salicylate  Anturane Depen Capsules Marnal Soma  APF Arthritis pain formula Dewitt's Pills Measurin Stanback  Argesic Dia-Gesic Meclofenamic Sulfinpyrazone  Arthritis Bayer Timed Release Aspirin Diclofenac Meclomen Sulindac  Arthritis pain formula  Anacin Dicumarol Medipren Supac  Analgesic (Safety coated) Arthralgen Diffunasal Mefanamic Suprofen  Arthritis Strength Bufferin Dihydrocodeine Mepro Compound Suprol  Arthropan liquid Dopirydamole Methcarbomol with Aspirin Synalgos  ASA tablets/Enseals Disalcid Micrainin Tagament  Ascriptin Doan's Midol Talwin  Ascriptin A/D Dolene Mobidin Tanderil  Ascriptin Extra Strength Dolobid Moblgesic Ticlid  Ascriptin with Codeine Doloprin or Doloprin with Codeine Momentum Tolectin  Asperbuf Duoprin Mono-gesic Trendar  Aspergum Duradyne Motrin or Motrin IB Triminicin  Aspirin plain, buffered or enteric coated Durasal Myochrisine Trigesic  Aspirin Suppositories Easprin Nalfon Trillsate  Aspirin with Codeine Ecotrin Regular or Extra Strength Naprosyn Uracel  Atromid-S Efficin Naproxen Ursinus  Auranofin Capsules Elmiron Neocylate Vanquish  Axotal Emagrin Norgesic Verin  Azathioprine Empirin or Empirin with Codeine Normiflo Vitamin E  Azolid Emprazil Nuprin Voltaren  Bayer Aspirin plain, buffered or children's or timed BC Tablets or powders Encaprin Orgaran Warfarin Sodium  Buff-a-Comp Enoxaparin Orudis Zorpin  Buff-a-Comp with Codeine Equegesic Os-Cal-Gesic   Buffaprin Excedrin plain, buffered or Extra Strength Oxalid   Bufferin Arthritis Strength Feldene Oxphenbutazone   Bufferin plain or Extra Strength Feldene Capsules Oxycodone with Aspirin   Bufferin with Codeine Fenoprofen Fenoprofen Pabalate or Pabalate-SF   Buffets II Flogesic Panagesic   Buffinol plain or Extra Strength Florinal or Florinal with Codeine Panwarfarin   Buf-Tabs Flurbiprofen Penicillamine   Butalbital Compound Four-way cold tablets Penicillin   Butazolidin Fragmin Pepto-Bismol   Carbenicillin Geminisyn Percodan   Carna Arthritis Reliever Geopen Persantine   Carprofen Gold's salt Persistin   Chloramphenicol Goody's Phenylbutazone   Chloromycetin Haltrain Piroxlcam   Clmetidine heparin Plaquenil   Cllnoril Hyco-pap  Ponstel   Clofibrate Hydroxy chloroquine Propoxyphen         Before stopping any of these medications, be sure to consult the physician who ordered them.  Some, such as Coumadin (Warfarin) are ordered to prevent or treat serious conditions such as "deep thrombosis", "pumonary embolisms", and other heart problems.  The amount of time that you may need off of the medication may also vary with the medication and the reason for which you were taking it.  If you are taking any of these medications, please make sure you notify your pain physician before you undergo any procedures.   Moderate Conscious Sedation, Adult Sedation is the use of medicines to promote relaxation and relieve discomfort and anxiety. Moderate conscious sedation is a type of sedation. Under moderate conscious sedation, you are less alert than normal, but you are still able to  respond to instructions, touch, or both. Moderate conscious sedation is used during short medical and dental procedures. It is milder than deep sedation, which is a type of sedation under which you cannot be easily woken up. It is also milder than general anesthesia, which is the use of medicines to make you unconscious. Moderate conscious sedation allows you to return to your regular activities sooner. Tell a health care provider about:  Any allergies you have.  All medicines you are taking, including vitamins, herbs, eye drops, creams, and over-the-counter medicines.  Use of steroids (by mouth or creams).  Any problems you or family members have had with sedatives and anesthetic medicines.  Any blood disorders you have.  Any surgeries you have had.  Any medical conditions you have, such as sleep apnea.  Whether you are pregnant or may be pregnant.  Any use of cigarettes, alcohol, marijuana, or street drugs. What are the risks? Generally, this is a safe procedure. However, problems may occur, including:  Getting too much medicine  (oversedation).  Nausea.  Allergic reaction to medicines.  Trouble breathing. If this happens, a breathing tube may be used to help with breathing. It will be removed when you are awake and breathing on your own.  Heart trouble.  Lung trouble. What happens before the procedure? Staying hydrated Follow instructions from your health care provider about hydration, which may include:  Up to 2 hours before the procedure - you may continue to drink clear liquids, such as water, clear fruit juice, black coffee, and plain tea. Eating and drinking restrictions Follow instructions from your health care provider about eating and drinking, which may include:  8 hours before the procedure - stop eating heavy meals or foods such as meat, fried foods, or fatty foods.  6 hours before the procedure - stop eating light meals or foods, such as toast or cereal.  6 hours before the procedure - stop drinking milk or drinks that contain milk.  2 hours before the procedure - stop drinking clear liquids. Medicine Ask your health care provider about:  Changing or stopping your regular medicines. This is especially important if you are taking diabetes medicines or blood thinners.  Taking medicines such as aspirin and ibuprofen. These medicines can thin your blood. Do not take these medicines before your procedure if your health care provider instructs you not to.  Tests and exams  You will have a physical exam.  You may have blood tests done to show: ? How well your kidneys and liver are working. ? How well your blood can clot. General instructions  Plan to have someone take you home from the hospital or clinic.  If you will be going home right after the procedure, plan to have someone with you for 24 hours. What happens during the procedure?  An IV tube will be inserted into one of your veins.  Medicine to help you relax (sedative) will be given through the IV tube.  The medical or dental  procedure will be performed. What happens after the procedure?  Your blood pressure, heart rate, breathing rate, and blood oxygen level will be monitored often until the medicines you were given have worn off.  Do not drive for 24 hours. This information is not intended to replace advice given to you by your health care provider. Make sure you discuss any questions you have with your health care provider. Document Released: 01/23/2001 Document Revised: 10/04/2015 Document Reviewed: 08/20/2015 Elsevier Interactive Patient Education  2019 Elsevier  Inc.

## 2018-07-27 ENCOUNTER — Other Ambulatory Visit: Payer: Self-pay | Admitting: Student in an Organized Health Care Education/Training Program

## 2018-07-31 ENCOUNTER — Telehealth: Payer: Self-pay | Admitting: Student in an Organized Health Care Education/Training Program

## 2018-07-31 NOTE — Telephone Encounter (Signed)
Patient called stating she is out of meds... she is still waiting on procedure appt. And no meds refill appt. Was scheduled.  Wants to know if this can be called in for this time.

## 2018-07-31 NOTE — Telephone Encounter (Signed)
I have only prescribed this patient diclofenac which she stopped.  She is currently on Aleve.  I do not prescribe her any opioid medications.

## 2018-08-01 ENCOUNTER — Telehealth: Payer: Self-pay

## 2018-08-01 NOTE — Telephone Encounter (Signed)
Attempted x 2 to call patient.  No answering machine.

## 2018-08-01 NOTE — Telephone Encounter (Signed)
Patient states she is waiting on a call about her procedure.  Can you check and make sure that it is in the process of being approved?  Thank you

## 2018-11-13 ENCOUNTER — Telehealth: Payer: Self-pay | Admitting: Student in an Organized Health Care Education/Training Program

## 2018-11-13 NOTE — Telephone Encounter (Signed)
It does not. And I did call her when we started having procedures again and she said she wanted to wait.

## 2018-11-13 NOTE — Telephone Encounter (Addendum)
Patient lvmail stating she was supposed to be scheduled for procedure and due to covid was put on hold. Can she be scheduled for this asap. Does it need prior auth.  11-17-18 Spoke with patient she wants to have cortisone injections into both knees not the RFA. Has appt. Set up for 11-25-18 for eval w/ Dr. Holley Raring.  Will be going out of town with family the following week.

## 2018-11-24 ENCOUNTER — Encounter: Payer: Self-pay | Admitting: Student in an Organized Health Care Education/Training Program

## 2018-11-25 ENCOUNTER — Encounter: Payer: Self-pay | Admitting: Student in an Organized Health Care Education/Training Program

## 2018-11-25 ENCOUNTER — Other Ambulatory Visit: Payer: Self-pay

## 2018-11-25 ENCOUNTER — Other Ambulatory Visit
Admission: RE | Admit: 2018-11-25 | Discharge: 2018-11-25 | Disposition: A | Discharge status: Home / Self Care | Payer: Federal, State, Local not specified - PPO | Source: Ambulatory Visit | Attending: Student in an Organized Health Care Education/Training Program | Admitting: Student in an Organized Health Care Education/Training Program

## 2018-11-25 ENCOUNTER — Ambulatory Visit
Payer: Federal, State, Local not specified - PPO | Attending: Student in an Organized Health Care Education/Training Program | Admitting: Student in an Organized Health Care Education/Training Program

## 2018-11-25 DIAGNOSIS — Z1159 Encounter for screening for other viral diseases: Secondary | ICD-10-CM | POA: Diagnosis not present

## 2018-11-25 DIAGNOSIS — M170 Bilateral primary osteoarthritis of knee: Secondary | ICD-10-CM | POA: Diagnosis not present

## 2018-11-25 DIAGNOSIS — G894 Chronic pain syndrome: Secondary | ICD-10-CM | POA: Diagnosis not present

## 2018-11-25 LAB — SARS CORONAVIRUS 2 (TAT 6-24 HRS): SARS Coronavirus 2: NEGATIVE

## 2018-11-25 NOTE — Progress Notes (Signed)
Pain Management Virtual Encounter Note - Virtual Visit via Telephone Telehealth (real-time audio visits between healthcare provider and Meredith Caldwell).   Meredith Caldwell's Phone No. & Preferred Pharmacy:  [phone] (home); There is no such number on file (mobile).; (Preferred) [phone] No e-mail address on record  Meredith Meredith Caldwell, [name] [address] Phone: [phone] Fax: [phone]    Pre-screening note:  Our staff contacted Ms. Meredith Caldwell and offered her an "in person", "face-to-face" appointment versus a telephone encounter. Meredith Caldwell indicated preferring the telephone encounter, at this time.   Reason for Virtual Visit: COVID-19*  Social distancing based on CDC and AMA recommendations.   I contacted Meredith Meredith Caldwell on 11/25/2018 via telephone.      I clearly identified myself as Gillis Santa, MD. I verified that I was speaking with the correct person using two identifiers (Name: Meredith Meredith Caldwell, and date of birth: 10-Jul-1945).  Advanced Informed Consent I sought verbal advanced consent from Meredith Meredith Caldwell for virtual visit interactions. I informed Ms. Meredith Caldwell of possible security and privacy concerns, risks, and limitations associated with providing "not-in-person" medical evaluation and management services. I also informed Ms. Meredith Caldwell of the availability of "in-person" appointments. Finally, I informed her that there would be a charge for the virtual visit and that Meredith Caldwell could be  personally, fully or partially, financially responsible for it. Ms. Meredith Caldwell expressed understanding and agreed to proceed.   Historic Elements   Ms. Meredith Meredith Caldwell is a 73 y.o. year old, female Meredith Caldwell evaluated today after her last encounter by our practice on 11/13/2018. Meredith Meredith Caldwell  has a past medical history of Bone spur (2000), Diabetes ([facility]) (2005), Edema (2000), Melanoma ([place]) (2008), Neuroma (2006), and  Vaso vagal episode (1992). Meredith Caldwell also  has a past surgical history that includes Tonsillectomy and adenoidectomy (1950); Appendectomy (1960); Cholecystectomy (1972); Abdominal hysterectomy (1991); Breast surgery (1980); Excision/release bursa hip (Left, 1997); Liver biopsy (2005); Melanoma excision (Left, 2008); and Knee surgery (Right, 2013). Meredith Meredith Caldwell has a current medication list which includes the following prescription(s): albuterol, aspirin ec, cetirizine, colesevelam, diclofenac, fish oil-krill oil, furosemide, losartan, metformin, montelukast, multivitamin with minerals, nabumetone, naproxen sodium, paroxetine hcl, penciclovir, and zolpidem. Meredith Caldwell  reports that Meredith Caldwell quit smoking about 49 years ago. Meredith Caldwell has never used smokeless tobacco. Meredith Caldwell reports current alcohol use. Meredith Caldwell reports that Meredith Caldwell does not use drugs. Meredith Meredith Caldwell is allergic to statins; theophyllines; and zetia [ezetimibe].   HPI  Today, Meredith Caldwell is being contacted for worsening of previously known (established) problem   Meredith Caldwell is endorsing increased pain in both knees.  Meredith Caldwell is status post intra-articular knee steroid injections in the past which were beneficial.  Given the current COVID 19 situation Meredith Caldwell prefers to defer her left knee genicular RFA although Meredith Caldwell did have good benefit from her diagnostic genicular nerve blocks.  We will get her scheduled for bilateral knee intra-articular steroid injections.  Meredith Caldwell is going out of town this Sunday.  Assessment  The primary encounter diagnosis was Bilateral primary osteoarthritis of knee. A diagnosis of Chronic pain syndrome was also pertinent to this visit.  Plan of Care  I am having Meredith Meredith Caldwell maintain her albuterol, aspirin EC, colesevelam, furosemide, losartan, cetirizine, metFORMIN, montelukast, nabumetone, PAROXETINE HCL ER PO, penciclovir, zolpidem, FISH OIL-KRILL OIL PO, multivitamin with minerals, naproxen sodium, and diclofenac.   Orders:  Orders Placed This Encounter   Procedures  . KNEE INJECTION  Local Anesthetic & Steroid injection.    Standing Status:   Future    Standing Expiration Date:   12/25/2018    Scheduling Instructions:     Side: Bilateral     Sedation: None     Timeframe: Thursday, please call pt with time    Order Specific Question:   Where will this procedure be performed?    Answer:   [facility]   Follow-up plan:   Return in about 2 days (around 11/27/2018) for Procedure B/L KNEE STEROID.     Status post genicular nerve block on left, effective.  Scheduled for left genicular nerve RFA but Meredith Caldwell has postponed given COVID-19.  Can do in the future.  For time being Meredith Caldwell requesting intra-articular knee steroid injections.   Recent Visits No visits were found meeting these conditions.  Showing recent visits within past 90 days and meeting all other requirements   Today's Visits Date Type Provider Dept  11/25/18 Office Visit Gillis Santa, MD [facility]  Showing today's visits and meeting all other requirements   Future Appointments No visits were found meeting these conditions.  Showing future appointments within next 90 days and meeting all other requirements   I discussed the assessment and treatment plan with the Meredith Caldwell. The Meredith Caldwell was provided an opportunity to ask questions and all were answered. The Meredith Caldwell agreed with the plan and demonstrated an understanding of the instructions.  Meredith Caldwell advised to call back or seek an in-person evaluation if the symptoms or condition worsens.  Total duration of non-face-to-face encounter: 15 minutes.  Note by: Gillis Santa, MD Date: 11/25/2018; Time: 10:28 AM  Note: This dictation was prepared with Dragon dictation. Any transcriptional errors that may result from this process are unintentional.  Disclaimer:  * Given the special circumstances of the COVID-19 pandemic, the federal government has announced that the Office for Civil Rights (OCR) will exercise  its enforcement discretion and will not impose penalties on physicians using telehealth in the event of noncompliance with regulatory requirements under the [place] (HIPAA) in connection with the good faith provision of telehealth during the ASNKN-39 national public health emergency. ([place])

## 2018-11-27 ENCOUNTER — Ambulatory Visit
Payer: Federal, State, Local not specified - PPO | Attending: Student in an Organized Health Care Education/Training Program | Admitting: Student in an Organized Health Care Education/Training Program

## 2018-11-27 ENCOUNTER — Encounter: Payer: Self-pay | Admitting: Student in an Organized Health Care Education/Training Program

## 2018-11-27 ENCOUNTER — Other Ambulatory Visit: Payer: Self-pay

## 2018-11-27 VITALS — BP 128/80 | HR 83 | Temp 98.3°F | Resp 16 | Ht 63.75 in | Wt 200.0 lb

## 2018-11-27 DIAGNOSIS — M170 Bilateral primary osteoarthritis of knee: Secondary | ICD-10-CM

## 2018-11-27 MED ORDER — LIDOCAINE HCL 2 % IJ SOLN
20.0000 mL | Freq: Once | INTRAMUSCULAR | Status: AC
  Administered 2018-11-27: 400 mg
  Filled 2018-11-27: qty 20

## 2018-11-27 MED ORDER — ROPIVACAINE HCL 2 MG/ML IJ SOLN
1.0000 mL | Freq: Once | INTRAMUSCULAR | Status: AC
  Administered 2018-11-27: 1 mL via EPIDURAL
  Filled 2018-11-27: qty 10

## 2018-11-27 MED ORDER — DEXAMETHASONE SODIUM PHOSPHATE 10 MG/ML IJ SOLN
10.0000 mg | Freq: Once | INTRAMUSCULAR | Status: AC
  Administered 2018-11-27: 10 mg
  Filled 2018-11-27: qty 1

## 2018-11-27 NOTE — Progress Notes (Signed)
Patient's Name: Meredith Caldwell  MRN: [id number]  Referring Provider: Baxter Hire, MD  DOB: 1945/11/13  PCP: Baxter Hire, MD  DOS: 11/27/2018  Note by: Gillis Santa, MD  Service setting: Ambulatory outpatient  Specialty: Interventional Pain Management  Patient type: Established  Location: [facility] (AM[facility]) [facility] Facility  Visit type: Interventional Procedure   Primary Reason for Visit: Interventional Pain Management Treatment. CC: Knee Pain (bilateral)  Procedure:          Anesthesia, Analgesia, Anxiolysis:  Type: Diagnostic Intra-Articular Local anesthetic and steroid Knee Injection #1  Region: Lateral infrapatellar Knee Region Level: Knee Joint Laterality: Bilateral  Type: Local Anesthesia Indication(s): Analgesia         Local Anesthetic: Lidocaine 1-2% Route: Infiltration ([place]/IM) IV Access: Declined Sedation: Declined   Position: Sitting   Indications: 1. Bilateral primary osteoarthritis of knee    Pain Score: Pre-procedure: 8 /10 Post-procedure: 2 /10   Pre-op Assessment:  Meredith Caldwell is a 73 y.o. (year old), female patient, seen today for interventional treatment. She  has a past surgical history that includes Tonsillectomy and adenoidectomy (1950); Appendectomy (1960); Cholecystectomy (1972); Abdominal hysterectomy (1991); Breast surgery (1980); Excision/release bursa hip (Left, 1997); Liver biopsy (2005); Melanoma excision (Left, 2008); and Knee surgery (Right, 2013). Meredith Caldwell has a current medication list which includes the following prescription(s): albuterol, aspirin ec, cetirizine, colesevelam, diclofenac, fish oil-krill oil, furosemide, losartan, metformin, montelukast, multivitamin with minerals, nabumetone, naproxen sodium, paroxetine hcl, penciclovir, and zolpidem, and the following Facility-Administered Medications: ropivacaine (pf) 2 mg/ml (0.2%). Her primarily concern today is the Knee Pain (bilateral)  Initial Vital Signs:  Pulse/HCG Rate: 85   Temp: 98.3 F (36.8 C) Resp: 16 BP: (!) 144/54 SpO2: 93 %  BMI: Estimated body mass index is 34.6 kg/m as calculated from the following:   Height as of this encounter: 5' 3.75" (1.619 m).   Weight as of this encounter: 200 lb (90.7 kg).  Risk Assessment: Allergies: Reviewed. She is allergic to statins; theophyllines; and zetia [ezetimibe].  Allergy Precautions: None required Coagulopathies: Reviewed. None identified.  Blood-thinner therapy: None at this time Active Infection(s): Reviewed. None identified. Meredith Caldwell is afebrile  Site Confirmation: Meredith Caldwell was asked to confirm the procedure and laterality before marking the site Procedure checklist: Completed Consent: Before the procedure and under the influence of no sedative(s), amnesic(s), or anxiolytics, the patient was informed of the treatment options, risks and possible complications. To fulfill our ethical and legal obligations, as recommended by the American Medical Association's Code of Ethics, I have informed the patient of my clinical impression; the nature and purpose of the treatment or procedure; the risks, benefits, and possible complications of the intervention; the alternatives, including doing nothing; the risk(s) and benefit(s) of the alternative treatment(s) or procedure(s); and the risk(s) and benefit(s) of doing nothing. The patient was provided information about the general risks and possible complications associated with the procedure. These may include, but are not limited to: failure to achieve desired goals, infection, bleeding, organ or nerve damage, allergic reactions, paralysis, and death. In addition, the patient was informed of those risks and complications associated to the procedure, such as failure to decrease pain; infection; bleeding; organ or nerve damage with subsequent damage to sensory, motor, and/or autonomic systems, resulting in permanent pain, numbness, and/or weakness of one or several  areas of the body; allergic reactions; (i.e.: anaphylactic reaction); and/or death. Furthermore, the patient was informed of those risks and complications associated with the medications. These include, but  are not limited to: allergic reactions (i.e.: anaphylactic or anaphylactoid reaction(s)); adrenal axis suppression; blood sugar elevation that in diabetics may result in ketoacidosis or comma; water retention that in patients with history of congestive heart failure may result in shortness of breath, pulmonary edema, and decompensation with resultant heart failure; weight gain; swelling or edema; medication-induced neural toxicity; particulate matter embolism and blood vessel occlusion with resultant organ, and/or nervous system infarction; and/or aseptic necrosis of one or more joints. Finally, the patient was informed that Medicine is not an exact science; therefore, there is also the possibility of unforeseen or unpredictable risks and/or possible complications that may result in a catastrophic outcome. The patient indicated having understood very clearly. We have given the patient no guarantees and we have made no promises. Enough time was given to the patient to ask questions, all of which were answered to the patient's satisfaction. Meredith Caldwell has indicated that she wanted to continue with the procedure. Attestation: I, the ordering provider, attest that I have discussed with the patient the benefits, risks, side-effects, alternatives, likelihood of achieving goals, and potential problems during recovery for the procedure that I have provided informed consent. Date  Time: 11/27/2018  9:47 AM  Pre-Procedure Preparation:  Monitoring: As per clinic protocol. Respiration, ETCO2, SpO2, BP, heart rate and rhythm monitor placed and checked for adequate function Safety Precautions: Patient was assessed for positional comfort and pressure points before starting the procedure. Time-out: I initiated and  conducted the "Time-out" before starting the procedure, as per protocol. The patient was asked to participate by confirming the accuracy of the "Time Out" information. Verification of the correct person, site, and procedure were performed and confirmed by me, the nursing staff, and the patient. "Time-out" conducted as per Joint Commission's Universal Protocol (UP.01.01.01). Time: 1026  Description of Procedure:          Target Area: Knee Joint Approach: Just above the Lateral tibial plateau, lateral to the infrapatellar tendon. Area Prepped: Entire knee area, from the mid-thigh to the mid-shin. Prepping solution: DuraPrep (Iodine Povacrylex [0.7% available iodine] and Isopropyl Alcohol, 74% w/w) Safety Precautions: Aspiration looking for blood return was conducted prior to all injections. At no point did we inject any substances, as a needle was being advanced. No attempts were made at seeking any paresthesias. Safe injection practices and needle disposal techniques used. Medications properly checked for expiration dates. SDV (single dose vial) medications used. Description of the Procedure: Protocol guidelines were followed. The patient was placed in position over the fluoroscopy table. The target area was identified and the area prepped in the usual manner. Skin & deeper tissues infiltrated with local anesthetic. Appropriate amount of time allowed to pass for local anesthetics to take effect. The procedure needles were then advanced to the target area. Proper needle placement secured. Negative aspiration confirmed. Solution injected in intermittent fashion, asking for systemic symptoms every 0.5cc of injectate. The needles were then removed and the area cleansed, making sure to leave some of the prepping solution back to take advantage of its long term bactericidal properties. Vitals:   11/27/18 0957 11/27/18 1001  BP: (!) 144/54 128/80  Pulse: 85 83  Resp: 16 16  Temp: 98.3 F (36.8 C) 98.3 F  (36.8 C)  TempSrc: Oral   SpO2: 93% 94%  Weight: 200 lb (90.7 kg)   Height: 5' 3.75" (1.619 m)     Start Time: 1027 hrs. End Time: 1032 hrs. Materials:  Needle(s) Type: Regular needle Gauge: 25G Length: 1.5-in  Medication(s): Please see orders for medications and dosing details. 5 cc solution made of 4 cc of 0.2% ropivacaine, 1 cc of Decadron 10 mg/cc.  2.5 cc injected in each knee bilaterally. Imaging Guidance:          Type of Imaging Technique: None used Indication(s): N/A Exposure Time: No patient exposure Contrast: None used. Fluoroscopic Guidance: N/A Ultrasound Guidance: N/A Interpretation: N/A  Antibiotic Prophylaxis:   Anti-infectives (From admission, onward)   None     Indication(s): None identified  Post-operative Assessment:  Post-procedure Vital Signs:  Pulse/HCG Rate: 83  Temp: 98.3 F (36.8 C) Resp: 16 BP: 128/80 SpO2: 94 %  EBL: None  Complications: No immediate post-treatment complications observed by team, or reported by patient.  Note: The patient tolerated the entire procedure well. A repeat set of vitals were taken after the procedure and the patient was kept under observation following institutional policy, for this type of procedure. Post-procedural neurological assessment was performed, showing return to baseline, prior to discharge. The patient was provided with post-procedure discharge instructions, including a section on how to identify potential problems. Should any problems arise concerning this procedure, the patient was given instructions to immediately contact us, at any time, without hesitation. In any case, we plan to contact the patient by telephone for a follow-up status report regarding this interventional procedure.  Comments:  No additional relevant information.  Plan of Care  Orders:  No orders of the defined types were placed in this encounter.   Medications ordered for procedure: Meds ordered this encounter  Medications   . lidocaine (XYLOCAINE) 2 % (with pres) injection 400 mg  . dexamethasone (DECADRON) injection 10 mg  . ropivacaine (PF) 2 mg/mL (0.2%) (NAROPIN) injection 1 mL   Medications administered: We administered lidocaine and dexamethasone.  See the medical record for exact dosing, route, and time of administration.  Follow-up plan:   Return if symptoms worsen or fail to improve.     Recent Visits Date Type Provider Dept  11/25/18 Office Visit Gillis Santa, MD [facility]  Showing recent visits within past 90 days and meeting all other requirements   Today's Visits Date Type Provider Dept  11/27/18 Procedure visit Gillis Santa, MD [facility]  Showing today's visits and meeting all other requirements   Future Appointments No visits were found meeting these conditions.  Showing future appointments within next 90 days and meeting all other requirements   Disposition: Discharge home  Discharge Date & Time: 11/27/2018; 1037 hrs.   Primary Care Physician: Baxter Hire, MD Location: [facility] Note by: Gillis Santa, MD Date: 11/27/2018; Time: 10:47 AM  Disclaimer:  Medicine is not an exact science. The only guarantee in medicine is that nothing is guaranteed. It is important to note that the decision to proceed with this intervention was based on the information collected from the patient. The Data and conclusions were drawn from the patient's questionnaire, the interview, and the physical examination. Because the information was provided in large part by the patient, it cannot be guaranteed that it has not been purposely or unconsciously manipulated. Every effort has been made to obtain as much relevant data as possible for this evaluation. It is important to note that the conclusions that lead to this procedure are derived in large part from the available data. Always take into account that the treatment will also be dependent on  availability of resources and existing treatment guidelines, considered by other Pain Management Practitioners  as being common knowledge and practice, at the time of the intervention. For Medico-Legal purposes, it is also important to point out that variation in procedural techniques and pharmacological choices are the acceptable norm. The indications, contraindications, technique, and results of the above procedure should only be interpreted and judged by a Board-Certified Interventional Pain Specialist with extensive familiarity and expertise in the same exact procedure and technique.

## 2018-11-27 NOTE — Progress Notes (Signed)
Safety precautions to be maintained throughout the outpatient stay will include: orient to surroundings, keep bed in low position, maintain call bell within reach at all times, provide assistance with transfer out of bed and ambulation.  

## 2018-11-28 ENCOUNTER — Telehealth: Payer: Self-pay | Admitting: *Deleted

## 2018-11-28 NOTE — Telephone Encounter (Signed)
Doing well. No issues. States the spray made the procedure much more tolerable.

## 2018-12-01 ENCOUNTER — Ambulatory Visit
Payer: Federal, State, Local not specified - PPO | Admitting: Student in an Organized Health Care Education/Training Program

## 2019-11-28 IMAGING — MR MR KNEE*R* W/O CM
6 series · 40 of 40 positions shown · non-contrast
Comparison: MRI right knee 12/22/2010.

CLINICAL DATA: Diffuse right knee pain. History of fall in November 2017. The patient underwent right knee surgery in 6151.

EXAM:
MRI OF THE RIGHT KNEE WITHOUT CONTRAST
TECHNIQUE: Multiplanar, multisequence MR imaging of the knee was performed. No
intravenous contrast was administered.

[Series 8: T2 fat-sat · axial · right · 4.0mm · 0.50mm/px · z∈[-56,+69]mm · 6 of 26 slices shown (1 of 3)]
[im 1/26]
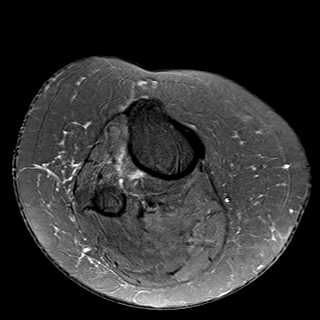
[im 6/26]
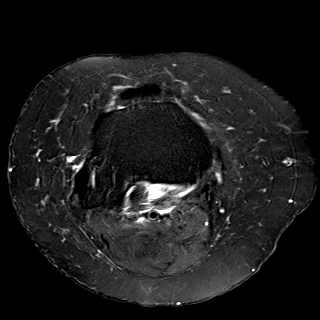
[im 11/26]
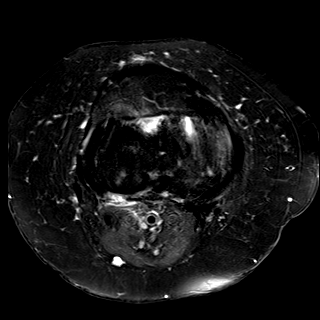
[im 16/26]
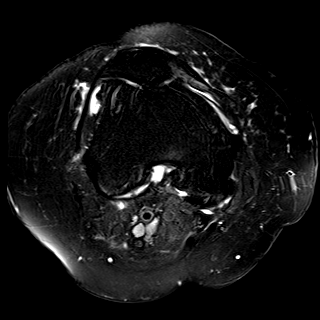
[im 21/26]
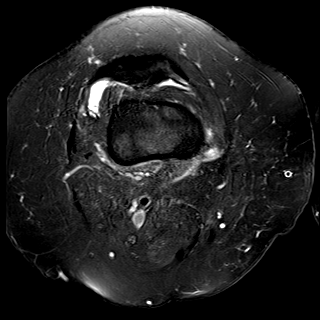
[im 26/26]
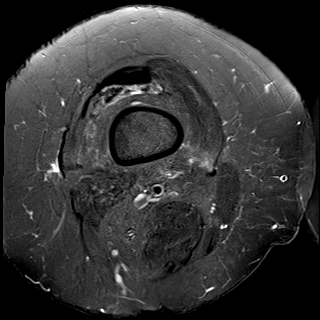

[Series 9: T1 · coronal · right · 4.0mm · 0.59mm/px · 7 of 30 slices shown]
[im 1/30]
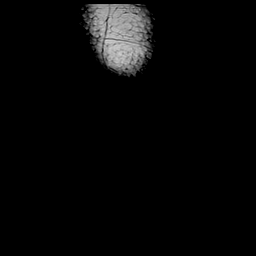
[im 5/30]
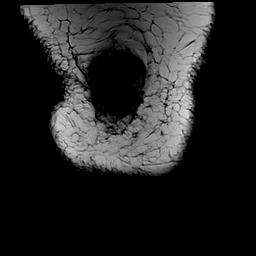
[im 10/30]
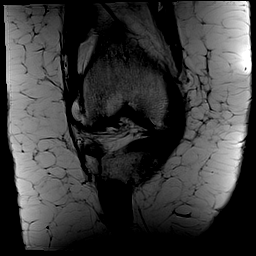
[im 15/30]
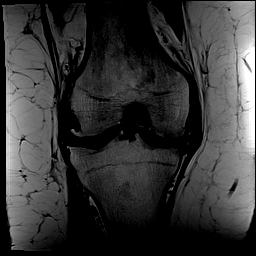
[im 20/30]
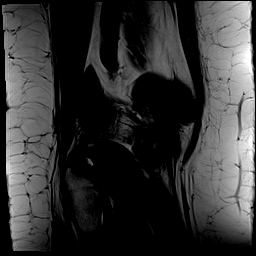
[im 25/30]
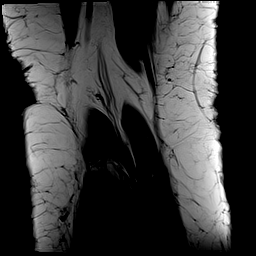
[im 30/30]
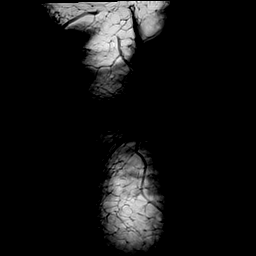

[Series 10: T2 fat-sat · coronal · right · 4.0mm · 0.59mm/px · 6 of 29 slices shown (2 of 3)]
[im 1/29]
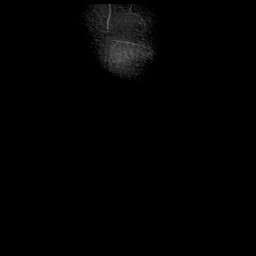
[im 6/29]
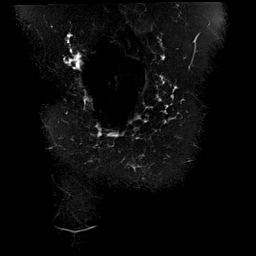
[im 12/29]
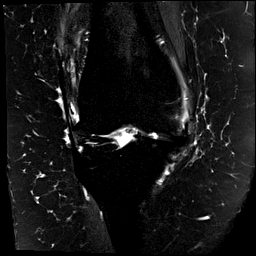
[im 17/29]
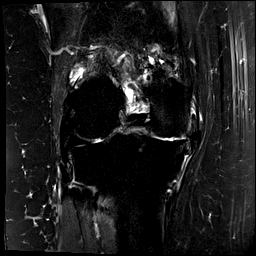
[im 23/29]
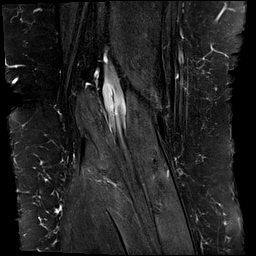
[im 29/29]
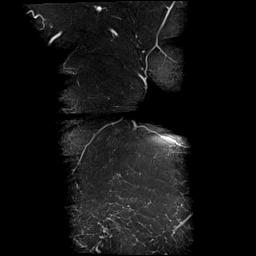

[Series 11: PD fat-sat · coronal · right · 4.0mm · 0.59mm/px · 6 of 29 slices shown (1 of 2)]
[im 1/29]
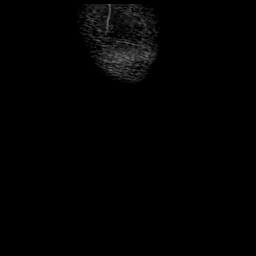
[im 6/29]
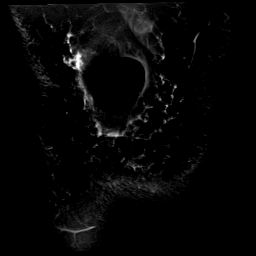
[im 12/29]
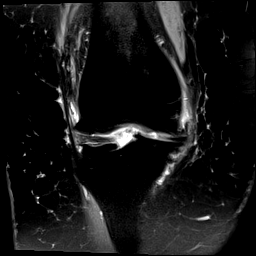
[im 17/29]
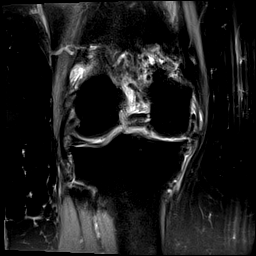
[im 23/29]
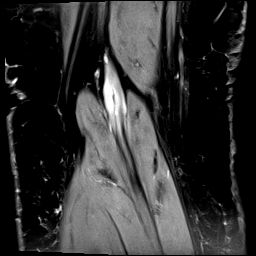
[im 29/29]
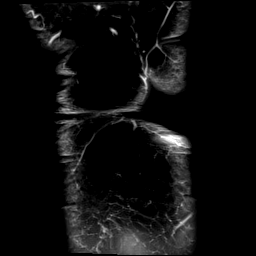

[Series 12: PD fat-sat · sagittal · right · 3.0mm · 0.59mm/px · 7 of 33 slices shown (2 of 2)]
[im 1/33]
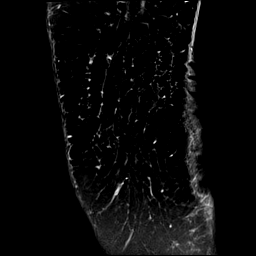
[im 6/33]
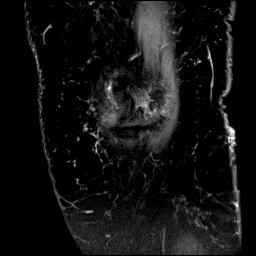
[im 11/33]
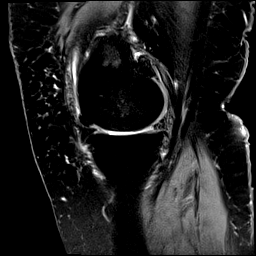
[im 17/33]
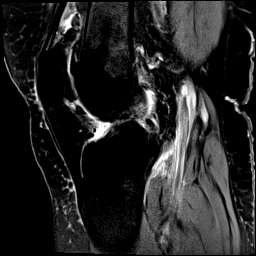
[im 22/33]
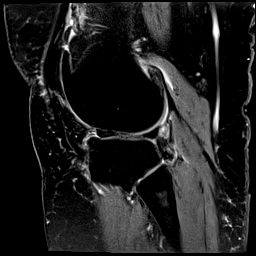
[im 27/33]
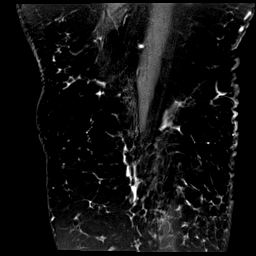
[im 33/33]
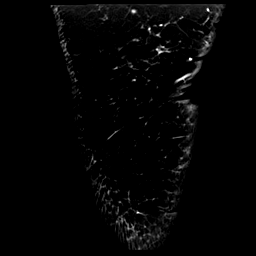

[Series 13: T2 fat-sat · sagittal · right · 3.0mm · 0.59mm/px · 8 of 35 slices shown (3 of 3)]
[im 1/35]
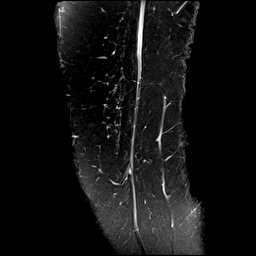
[im 5/35]
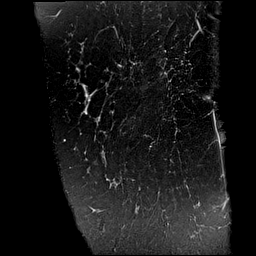
[im 10/35]
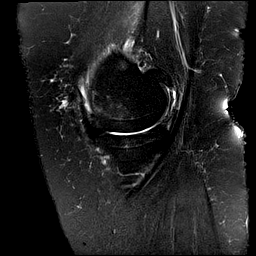
[im 15/35]
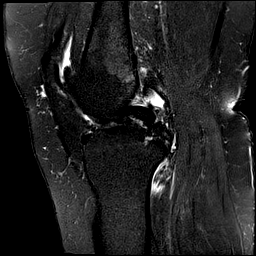
[im 20/35]
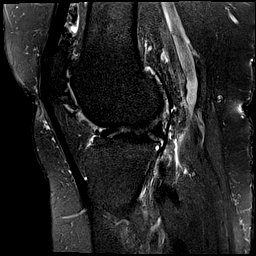
[im 25/35]
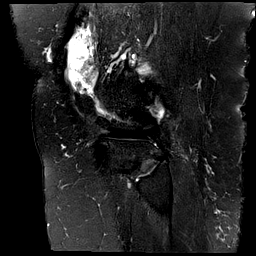
[im 30/35]
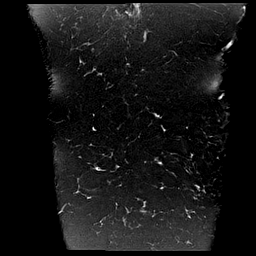
[im 35/35]
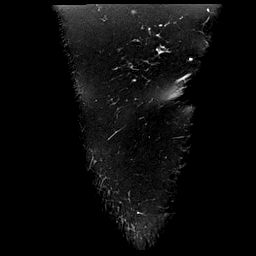

[40 of 40 positions shown; findings below may reference images not displayed]

FINDINGS: MENISCI

Medial meniscus: Radial tear at the root of the posterior horn of
the medial meniscus is again seen. The remainder of the posterior
horn is diminutive with blunting along its free edge likely related
to prior surgery. The body is severely degenerated and extruded
peripherally with a horizontal tear reaching the meniscal
undersurface.

Lateral meniscus:  Intact.

LIGAMENTS

Cruciates:  Intact.

Collaterals:  Intact.

CARTILAGE

Patellofemoral: Diffuse, severe cartilage loss is again seen and
appears somewhat worse than on the prior exam.

Medial:  Diffuse and severe cartilage loss is again seen.

Lateral:  No marked change in thinning and irregularity throughout.

Joint:  Small effusion.

Popliteal Fossa:  No Baker's cyst.

Extensor Mechanism:  Intact.

Bones: Tricompartmental osteophytosis has worsened since the prior
examination, particularly on the medial side. No fracture or
worrisome lesion.

Other: None.
IMPRESSION: Dominant finding is advanced osteoarthritis about the knee appearing
worst in the medial and patellofemoral compartments.

Radial tear at the root of the posterior of the medial meniscus is
unchanged in appearance since the prior exam. The remainder of the
posterior horn appears to have been debrided. Severely degenerated
and diminutive body of the medial meniscus is extruded peripherally.

## 2019-12-28 ENCOUNTER — Other Ambulatory Visit: Payer: Self-pay

## 2019-12-28 ENCOUNTER — Ambulatory Visit
Payer: Federal, State, Local not specified - PPO | Attending: Student in an Organized Health Care Education/Training Program | Admitting: Student in an Organized Health Care Education/Training Program

## 2019-12-28 ENCOUNTER — Encounter: Payer: Self-pay | Admitting: Student in an Organized Health Care Education/Training Program

## 2019-12-28 VITALS — BP 132/80 | HR 79 | Temp 97.0°F | Ht 64.0 in | Wt 200.0 lb

## 2019-12-28 DIAGNOSIS — G894 Chronic pain syndrome: Secondary | ICD-10-CM | POA: Diagnosis present

## 2019-12-28 DIAGNOSIS — M1712 Unilateral primary osteoarthritis, left knee: Secondary | ICD-10-CM | POA: Diagnosis present

## 2019-12-28 DIAGNOSIS — M170 Bilateral primary osteoarthritis of knee: Secondary | ICD-10-CM

## 2019-12-28 DIAGNOSIS — G8929 Other chronic pain: Secondary | ICD-10-CM | POA: Diagnosis present

## 2019-12-28 DIAGNOSIS — M25562 Pain in left knee: Secondary | ICD-10-CM

## 2019-12-28 DIAGNOSIS — M25561 Pain in right knee: Secondary | ICD-10-CM | POA: Diagnosis present

## 2019-12-28 DIAGNOSIS — M1711 Unilateral primary osteoarthritis, right knee: Secondary | ICD-10-CM | POA: Insufficient documentation

## 2019-12-28 NOTE — Patient Instructions (Signed)

## 2019-12-28 NOTE — Progress Notes (Signed)
PROVIDER NOTE: Information contained herein reflects review and annotations entered in association with encounter. Interpretation of such information and data should be left to medically-trained personnel. Information provided to patient can be located elsewhere in the medical record under "Patient Instructions". Document created using STT-dictation technology, any transcriptional errors that may result from process are unintentional.    Patient: Meredith Caldwell  Service Category: E/M  Provider: Gillis Santa, MD  DOB: 01/02/1946  DOS: 12/28/2019  Specialty: Interventional Pain Management  MRN: [id number]  Setting: Ambulatory outpatient  PCP: Baxter Hire, MD  Type: Established Patient    Referring Provider: Baxter Hire, MD  Location: Office  Delivery: Face-to-face     HPI  Reason for encounter: Ms. Meredith Caldwell, a 74 y.o. year old female, is here today for evaluation and management of her Bilateral primary osteoarthritis of knee [M17.0]. Ms. Meredith Caldwell primary complain today is Knee Pain Last encounter: Practice (Visit date not found). My last encounter with her was on Visit date not found. Pertinent problems: Ms. Meredith Caldwell has Primary osteoarthritis of right knee; Diabetes mellitus type 2, uncomplicated ([place]); Chronic pain of left knee; Chronic pain syndrome; and Arthritis of left knee on their pertinent problem list. Pain Assessment: Severity of Chronic pain is reported as a 7 /10. Location: Knee Right, Left/Right knee huirts more than the left. Onset: More than a month ago. Quality: Shooting, Throbbing. Timing: Intermittent. Modifying factor(s): procedures, hot tub bath. Vitals:  height is 5' 4"  (1.626 m) and weight is 200 lb (90.7 kg). Her temperature is 97 F (36.1 C) (abnormal). Her blood pressure is 132/80 and her pulse is 79. Her oxygen saturation is 98%.   Patient presents today with worsening knee pain, right greater than left.  Patient is status post intra-articular knee  steroid injection performed on 11/27/2018 that provided her significant pain relief in regards to her knee pain and improvement in her functional status.  This lasted for approximately 10 months with gradual return of pain over the last month.  She is interested in repeating intra-articular knee steroid    ROS  Constitutional: Denies any fever or chills Gastrointestinal: No reported hemesis, hematochezia, vomiting, or acute GI distress Musculoskeletal: Bilateral knee pain, worse with weightbearing Neurological: No reported episodes of acute onset apraxia, aphasia, dysarthria, agnosia, amnesia, paralysis, loss of coordination, or loss of consciousness  Medication Review  Fish Oil-Krill Oil, PARoxetine HCl, albuterol, aspirin EC, cetirizine, colesevelam, diclofenac, furosemide, losartan, metFORMIN, montelukast, multivitamin with minerals, nabumetone, naproxen sodium, penciclovir, and zolpidem  History Review  Allergy: Ms. Meredith Caldwell is allergic to statins, theophyllines, and zetia [ezetimibe]. Drug: Ms. Meredith Caldwell  reports no history of drug use. Alcohol:  reports current alcohol use. Tobacco:  reports that she quit smoking about 50 years ago. She has never used smokeless tobacco. Social: Ms. Meredith Caldwell  reports that she quit smoking about 50 years ago. She has never used smokeless tobacco. She reports current alcohol use. She reports that she does not use drugs. Medical:  has a past medical history of Bone spur (2000), Diabetes ([place]) (2005), Edema (2000), Melanoma ([place]) (2008), Neuroma (2006), and Vaso vagal episode (1992). Surgical: Ms. Meredith Caldwell  has a past surgical history that includes Tonsillectomy and adenoidectomy (1950); Appendectomy (1960); Cholecystectomy (1972); Abdominal hysterectomy (1991); Breast surgery (1980); Excision/release bursa hip (Left, 1997); Liver biopsy (2005); Melanoma excision (Left, 2008); and Knee surgery (Right, 2013). Family: family history is not on  file.  Laboratory Chemistry Profile   Renal No results found for: BUN, CREATININE, LABCREA, BCR,  GFR, GFRAA, GFRNONAA, LABVMA, EPIRU, [id number], NOREPRU, [id number], DOPARU, [id number]   Hepatic No results found for: AST, ALT, ALBUMIN, ALKPHOS, HCVAB, AMYLASE, LIPASE, AMMONIA   Electrolytes No results found for: NA, K, CL, CALCIUM, MG, PHOS   Bone No results found for: VD25OH, [id number], QI2979GX2, [id number], 25OHVITD1, 25OHVITD2, 25OHVITD3, TESTOFREE, TESTOSTERONE   Inflammation (CRP: Acute Phase) (ESR: Chronic Phase) No results found for: CRP, ESRSEDRATE, LATICACIDVEN     Note: Above Lab results reviewed.  Recent Imaging Review  DG C-Arm 1-60 Min-No Report Fluoroscopy was utilized by the requesting physician.  No radiographic  interpretation.  Note: Reviewed        Physical Exam  General appearance: Well nourished, well developed, and well hydrated. In no apparent acute distress Mental status: Alert, oriented x 3 (person, place, & time)       Respiratory: No evidence of acute respiratory distress Eyes: PERLA Vitals: BP 132/80   Pulse 79   Temp (!) 97 F (36.1 C)   Ht 5' 4"  (1.626 m)   Wt 200 lb (90.7 kg)   SpO2 98%   BMI 34.33 kg/m  BMI: Estimated body mass index is 34.33 kg/m as calculated from the following:   Height as of this encounter: 5' 4"  (1.626 m).   Weight as of this encounter: 200 lb (90.7 kg). Ideal: Ideal body weight: 54.7 kg (120 lb 9.5 oz) Adjusted ideal body weight: 69.1 kg (152 lb 5.7 oz)   Gait & Posture Assessment  Ambulation: Patient came in today in a wheel chair Gait: Antalgic Posture: Difficulty standing up straight, due to pain  Lower Extremity Exam    Side: Right lower extremity  Side: Left lower extremity  Stability: No instability observed          Stability: No instability observed          Skin & Extremity Inspection: Skin color, temperature, and hair growth are WNL. No peripheral edema or cyanosis. No masses, redness,  swelling, asymmetry, or associated skin lesions. No contractures.  Skin & Extremity Inspection: Skin color, temperature, and hair growth are WNL. No peripheral edema or cyanosis. No masses, redness, swelling, asymmetry, or associated skin lesions. No contractures.  Functional ROM: Pain restricted ROM for knee joint          Functional ROM: Pain restricted ROM for knee joint          Muscle Tone/Strength: Functionally intact. No obvious neuro-muscular anomalies detected.  Muscle Tone/Strength: Functionally intact. No obvious neuro-muscular anomalies detected.  Sensory (Neurological): Arthropathic arthralgia        Sensory (Neurological): Arthropathic arthralgia        DTR: Patellar: deferred today Achilles: deferred today Plantar: deferred today  DTR: Patellar: deferred today Achilles: deferred today Plantar: deferred today  Palpation: No palpable anomalies  Palpation: No palpable anomalies    Assessment   Status Diagnosis  Having a Flare-up Having a Flare-up Having a Flare-up 1. Bilateral primary osteoarthritis of knee   2. Arthritis of left knee   3. Primary osteoarthritis of right knee   4. Chronic pain of left knee   5. Chronic pain of right knee   6. Chronic pain syndrome      Updated Problems: Problem  Arthritis of Left Knee  Diabetes Mellitus Type 2, Uncomplicated ([facility])  Chronic Pain of Left Knee  Chronic Pain Syndrome   Patient presents today with worsening knee pain, right greater than left.  Patient is status post bilateral intra-articular knee steroid injection on 11/27/2018 that provided  her significant pain relief in regards to her knee pain and improvement in her functional status.  This lasted for approximately 10 months with gradual return of pain over the last month.   Primary Osteoarthritis of Right Knee    Plan of Care   Ms. Ahmiya Abee has a current medication list which includes the following long-term medication(s): albuterol, cetirizine,  colesevelam, furosemide, losartan, metformin, montelukast, paroxetine hcl, and zolpidem.  Orders:  Orders Placed This Encounter  Procedures  . KNEE INJECTION    Local Anesthetic & Steroid injection.    Standing Status:   Future    Standing Expiration Date:   03/29/2020    Scheduling Instructions:     Side: Bilateral     Sedation: None     Timeframe: As soon as schedule allows    Order Specific Question:   Where will this procedure be performed?    Answer:   [facility]   Follow-up plan:   Return in about 1 week (around 01/04/2020) for B/L IA knee steroid #2  without sedation.     Significant benefit after intra-articular knee steroid injection #1 performed 11/2018    Recent Visits No visits were found meeting these conditions. Showing recent visits within past 90 days and meeting all other requirements Today's Visits Date Type Provider Dept  12/28/19 Office Visit Gillis Santa, MD [facility]  Showing today's visits and meeting all other requirements Future Appointments No visits were found meeting these conditions. Showing future appointments within next 90 days and meeting all other requirements  I discussed the assessment and treatment plan with the patient. The patient was provided an opportunity to ask questions and all were answered. The patient agreed with the plan and demonstrated an understanding of the instructions.  Patient advised to call back or seek an in-person evaluation if the symptoms or condition worsens.  Duration of encounter: 20 minutes.  Note by: Gillis Santa, MD Date: 12/28/2019; Time: 10:48 AM

## 2019-12-28 NOTE — Progress Notes (Signed)
Safety precautions to be maintained throughout the outpatient stay will include: orient to surroundings, keep bed in low position, maintain call bell within reach at all times, provide assistance with transfer out of bed and ambulation.  

## 2020-01-04 ENCOUNTER — Other Ambulatory Visit: Payer: Self-pay

## 2020-01-04 ENCOUNTER — Encounter: Payer: Self-pay | Admitting: Student in an Organized Health Care Education/Training Program

## 2020-01-04 ENCOUNTER — Ambulatory Visit
Payer: Federal, State, Local not specified - PPO | Attending: Student in an Organized Health Care Education/Training Program | Admitting: Student in an Organized Health Care Education/Training Program

## 2020-01-04 VITALS — BP 103/73 | HR 80 | Temp 97.2°F | Resp 16 | Ht 63.75 in | Wt 200.0 lb

## 2020-01-04 DIAGNOSIS — M25561 Pain in right knee: Secondary | ICD-10-CM | POA: Diagnosis present

## 2020-01-04 DIAGNOSIS — G894 Chronic pain syndrome: Secondary | ICD-10-CM | POA: Diagnosis present

## 2020-01-04 DIAGNOSIS — M1712 Unilateral primary osteoarthritis, left knee: Secondary | ICD-10-CM | POA: Insufficient documentation

## 2020-01-04 DIAGNOSIS — M170 Bilateral primary osteoarthritis of knee: Secondary | ICD-10-CM | POA: Diagnosis present

## 2020-01-04 DIAGNOSIS — G8929 Other chronic pain: Secondary | ICD-10-CM | POA: Insufficient documentation

## 2020-01-04 DIAGNOSIS — M25562 Pain in left knee: Secondary | ICD-10-CM | POA: Diagnosis present

## 2020-01-04 MED ORDER — METHYLPREDNISOLONE ACETATE 40 MG/ML IJ SUSP
40.0000 mg | Freq: Once | INTRAMUSCULAR | Status: AC
  Administered 2020-01-04: 40 mg via INTRA_ARTICULAR
  Filled 2020-01-04: qty 1

## 2020-01-04 MED ORDER — ROPIVACAINE HCL 2 MG/ML IJ SOLN
4.0000 mL | Freq: Once | INTRAMUSCULAR | Status: AC
  Administered 2020-01-04: 10 mL via INTRA_ARTICULAR
  Filled 2020-01-04: qty 10

## 2020-01-04 MED ORDER — LIDOCAINE HCL 2 % IJ SOLN
10.0000 mL | Freq: Once | INTRAMUSCULAR | Status: AC
  Administered 2020-01-04: 200 mg
  Filled 2020-01-04: qty 20

## 2020-01-04 NOTE — Progress Notes (Signed)
Patient's Name: Meredith Caldwell  MRN: [id number]  Referring Provider: Baxter Hire, MD  DOB: 1946/02/08  PCP: Baxter Hire, MD  DOS: 01/04/2020  Note by: Gillis Santa, MD  Service setting: Ambulatory outpatient  Specialty: Interventional Pain Management  Patient type: Established  Location: [facility] (AMB) Pain Management Facility  Visit type: Interventional Procedure   Primary Reason for Visit: Interventional Pain Management Treatment. CC: Knee Pain (bilateral )  Procedure:          Anesthesia, Analgesia, Anxiolysis:  Type: Therapeutic Intra-Articular Local anesthetic and steroid Knee Injection #2 (# 12 November 2018) Region: Lateral infrapatellar Knee Region Level: Knee Joint Laterality: Bilateral  Type: Local Anesthesia Indication(s): Analgesia         Local Anesthetic: Lidocaine 1-2% Route: Infiltration ([place]/IM) IV Access: Declined Sedation: Declined   Position: Sitting   Indications: 1. Bilateral primary osteoarthritis of knee   2. Chronic pain of left knee   3. Chronic pain of right knee   4. Chronic pain syndrome   5. Arthritis of left knee    Pain Score: Pre-procedure: 8 /10 Post-procedure: 8 /10   Pre-op Assessment:  Meredith Caldwell is a 74 y.o. (year old), female patient, seen today for interventional treatment. She  has a past surgical history that includes Tonsillectomy and adenoidectomy (1950); Appendectomy (1960); Cholecystectomy (1972); Abdominal hysterectomy (1991); Breast surgery (1980); Excision/release bursa hip (Left, 1997); Liver biopsy (2005); Melanoma excision (Left, 2008); and Knee surgery (Right, 2013). Meredith Caldwell has a current medication list which includes the following prescription(s): albuterol, aspirin ec, cetirizine, colesevelam, diclofenac, fish oil-krill oil, furosemide, losartan, metformin, montelukast, multivitamin with minerals, nabumetone, naproxen sodium, paroxetine hcl, penciclovir, and zolpidem. Her primarily concern today is the Knee Pain  (bilateral )  Initial Vital Signs:  Pulse/HCG Rate: 80  Temp: (!) 97.2 F (36.2 C) Resp: 16 BP: 103/73 SpO2: 94 %  BMI: Estimated body mass index is 34.6 kg/m as calculated from the following:   Height as of this encounter: 5' 3.75" (1.619 m).   Weight as of this encounter: 200 lb (90.7 kg).  Risk Assessment: Allergies: Reviewed. She is allergic to statins, theophyllines, and zetia [ezetimibe].  Allergy Precautions: None required Coagulopathies: Reviewed. None identified.  Blood-thinner therapy: None at this time Active Infection(s): Reviewed. None identified. Meredith Caldwell is afebrile  Site Confirmation: Meredith Caldwell was asked to confirm the procedure and laterality before marking the site Procedure checklist: Completed Consent: Before the procedure and under the influence of no sedative(s), amnesic(s), or anxiolytics, the patient was informed of the treatment options, risks and possible complications. To fulfill our ethical and legal obligations, as recommended by the American Medical Association's Code of Ethics, I have informed the patient of my clinical impression; the nature and purpose of the treatment or procedure; the risks, benefits, and possible complications of the intervention; the alternatives, including doing nothing; the risk(s) and benefit(s) of the alternative treatment(s) or procedure(s); and the risk(s) and benefit(s) of doing nothing. The patient was provided information about the general risks and possible complications associated with the procedure. These may include, but are not limited to: failure to achieve desired goals, infection, bleeding, organ or nerve damage, allergic reactions, paralysis, and death. In addition, the patient was informed of those risks and complications associated to the procedure, such as failure to decrease pain; infection; bleeding; organ or nerve damage with subsequent damage to sensory, motor, and/or autonomic systems, resulting in  permanent pain, numbness, and/or weakness of one or several areas of the body;  allergic reactions; (i.e.: anaphylactic reaction); and/or death. Furthermore, the patient was informed of those risks and complications associated with the medications. These include, but are not limited to: allergic reactions (i.e.: anaphylactic or anaphylactoid reaction(s)); adrenal axis suppression; blood sugar elevation that in diabetics may result in ketoacidosis or comma; water retention that in patients with history of congestive heart failure may result in shortness of breath, pulmonary edema, and decompensation with resultant heart failure; weight gain; swelling or edema; medication-induced neural toxicity; particulate matter embolism and blood vessel occlusion with resultant organ, and/or nervous system infarction; and/or aseptic necrosis of one or more joints. Finally, the patient was informed that Medicine is not an exact science; therefore, there is also the possibility of unforeseen or unpredictable risks and/or possible complications that may result in a catastrophic outcome. The patient indicated having understood very clearly. We have given the patient no guarantees and we have made no promises. Enough time was given to the patient to ask questions, all of which were answered to the patient's satisfaction. Meredith Caldwell has indicated that she wanted to continue with the procedure. Attestation: I, the ordering provider, attest that I have discussed with the patient the benefits, risks, side-effects, alternatives, likelihood of achieving goals, and potential problems during recovery for the procedure that I have provided informed consent. Date  Time: 01/04/2020 10:36 AM  Pre-Procedure Preparation:  Monitoring: As per clinic protocol. Respiration, ETCO2, SpO2, BP, heart rate and rhythm monitor placed and checked for adequate function Safety Precautions: Patient was assessed for positional comfort and pressure points  before starting the procedure. Time-out: I initiated and conducted the "Time-out" before starting the procedure, as per protocol. The patient was asked to participate by confirming the accuracy of the "Time Out" information. Verification of the correct person, site, and procedure were performed and confirmed by me, the nursing staff, and the patient. "Time-out" conducted as per Joint Commission's Universal Protocol (UP.01.01.01). Time: 1101  Description of Procedure:          Target Area: Knee Joint Approach: Just above the Lateral tibial plateau, lateral to the infrapatellar tendon. Area Prepped: Entire knee area, from the mid-thigh to the mid-shin. Prepping solution: DuraPrep (Iodine Povacrylex [0.7% available iodine] and Isopropyl Alcohol, 74% w/w) Safety Precautions: Aspiration looking for blood return was conducted prior to all injections. At no point did we inject any substances, as a needle was being advanced. No attempts were made at seeking any paresthesias. Safe injection practices and needle disposal techniques used. Medications properly checked for expiration dates. SDV (single dose vial) medications used. Description of the Procedure: Protocol guidelines were followed. The patient was placed in position over the fluoroscopy table. The target area was identified and the area prepped in the usual manner. Skin & deeper tissues infiltrated with local anesthetic. Appropriate amount of time allowed to pass for local anesthetics to take effect. The procedure needles were then advanced to the target area. Proper needle placement secured. Negative aspiration confirmed. Solution injected in intermittent fashion, asking for systemic symptoms every 0.5cc of injectate. The needles were then removed and the area cleansed, making sure to leave some of the prepping solution back to take advantage of its long term bactericidal properties. Vitals:   01/04/20 1039  BP: 103/73  Pulse: 80  Resp: 16  Temp:  (!) 97.2 F (36.2 C)  TempSrc: Temporal  SpO2: 94%  Weight: 200 lb (90.7 kg)  Height: 5' 3.75" (1.619 m)    Start Time: 1102 hrs. End Time: 1106 hrs.  Materials:  Needle(s) Type: Regular needle Gauge: 25G Length: 1.5-in Medication(s): Please see orders for medications and dosing details.  10 cc solution made of 8 cc of 0.2% ropivacaine, 2 cc of methylprednisolone, 40 mg/cc.  5 cc injected in each knee.  Imaging Guidance:          Type of Imaging Technique: None used Indication(s): N/A Exposure Time: No patient exposure Contrast: None used. Fluoroscopic Guidance: N/A Ultrasound Guidance: N/A Interpretation: N/A  Antibiotic Prophylaxis:   Anti-infectives (From admission, onward)   None     Indication(s): None identified  Post-operative Assessment:  Post-procedure Vital Signs:  Pulse/HCG Rate: 80  Temp: (!) 97.2 F (36.2 C) Resp: 16 BP: 103/73 SpO2: 94 %  EBL: None  Complications: No immediate post-treatment complications observed by team, or reported by patient.  Note: The patient tolerated the entire procedure well. A repeat set of vitals were taken after the procedure and the patient was kept under observation following institutional policy, for this type of procedure. Post-procedural neurological assessment was performed, showing return to baseline, prior to discharge. The patient was provided with post-procedure discharge instructions, including a section on how to identify potential problems. Should any problems arise concerning this procedure, the patient was given instructions to immediately contact us, at any time, without hesitation. In any case, we plan to contact the patient by telephone for a follow-up status report regarding this interventional procedure.  Comments:  No additional relevant information.  Plan of Care   Medications ordered for procedure: Meds ordered this encounter  Medications  . methylPREDNISolone acetate (DEPO-MEDROL) injection 40 mg   . ropivacaine (PF) 2 mg/mL (0.2%) (NAROPIN) injection 4 mL  . methylPREDNISolone acetate (DEPO-MEDROL) injection 40 mg  . lidocaine (XYLOCAINE) 2 % (with pres) injection 200 mg   Medications administered: We administered methylPREDNISolone acetate, ropivacaine (PF) 2 mg/mL (0.2%), methylPREDNISolone acetate, and lidocaine.  See the medical record for exact dosing, route, and time of administration.  Follow-up plan:   Return in about 4 weeks (around 02/01/2020) for Post Procedure Evaluation, virtual.     Recent Visits Date Type Provider Dept  12/28/19 Office Visit Gillis Santa, MD [facility]  Showing recent visits within past 90 days and meeting all other requirements Today's Visits Date Type Provider Dept  01/04/20 Procedure visit Gillis Santa, MD [facility]  Showing today's visits and meeting all other requirements Future Appointments Date Type Provider Dept  02/01/20 Appointment Gillis Santa, MD [facility]  Showing future appointments within next 90 days and meeting all other requirements  Disposition: Discharge home  Discharge Date & Time: 01/04/2020; 1110 hrs.   Primary Care Physician: Baxter Hire, MD Location: [facility] Note by: Gillis Santa, MD Date: 01/04/2020; Time: 1:51 PM  Disclaimer:  Medicine is not an exact science. The only guarantee in medicine is that nothing is guaranteed. It is important to note that the decision to proceed with this intervention was based on the information collected from the patient. The Data and conclusions were drawn from the patient's questionnaire, the interview, and the physical examination. Because the information was provided in large part by the patient, it cannot be guaranteed that it has not been purposely or unconsciously manipulated. Every effort has been made to obtain as much relevant data as possible for this evaluation. It is important to note that the  conclusions that lead to this procedure are derived in large part from the available data. Always take into account that  the treatment will also be dependent on availability of resources and existing treatment guidelines, considered by other Pain Management Practitioners as being common knowledge and practice, at the time of the intervention. For Medico-Legal purposes, it is also important to point out that variation in procedural techniques and pharmacological choices are the acceptable norm. The indications, contraindications, technique, and results of the above procedure should only be interpreted and judged by a Board-Certified Interventional Pain Specialist with extensive familiarity and expertise in the same exact procedure and technique.

## 2020-01-04 NOTE — Patient Instructions (Signed)

## 2020-01-05 ENCOUNTER — Telehealth: Payer: Self-pay

## 2020-01-05 NOTE — Telephone Encounter (Signed)
Post procedure phone call. Patient states she is doing good.  

## 2020-01-27 ENCOUNTER — Telehealth: Payer: Self-pay

## 2020-01-27 NOTE — Telephone Encounter (Signed)
Called to confirm appt. No answer left message to call so we can review procedure from 8/23

## 2020-01-28 ENCOUNTER — Encounter: Payer: Self-pay | Admitting: Student in an Organized Health Care Education/Training Program

## 2020-01-28 ENCOUNTER — Other Ambulatory Visit: Payer: Self-pay

## 2020-01-28 ENCOUNTER — Ambulatory Visit
Payer: Federal, State, Local not specified - PPO | Attending: Student in an Organized Health Care Education/Training Program | Admitting: Student in an Organized Health Care Education/Training Program

## 2020-01-28 DIAGNOSIS — E669 Obesity, unspecified: Secondary | ICD-10-CM

## 2020-01-28 DIAGNOSIS — G894 Chronic pain syndrome: Secondary | ICD-10-CM

## 2020-01-28 DIAGNOSIS — M25562 Pain in left knee: Secondary | ICD-10-CM | POA: Diagnosis not present

## 2020-01-28 DIAGNOSIS — M25561 Pain in right knee: Secondary | ICD-10-CM | POA: Diagnosis not present

## 2020-01-28 DIAGNOSIS — G8929 Other chronic pain: Secondary | ICD-10-CM

## 2020-01-28 DIAGNOSIS — M170 Bilateral primary osteoarthritis of knee: Secondary | ICD-10-CM

## 2020-01-28 DIAGNOSIS — M1712 Unilateral primary osteoarthritis, left knee: Secondary | ICD-10-CM

## 2020-01-28 DIAGNOSIS — M1711 Unilateral primary osteoarthritis, right knee: Secondary | ICD-10-CM

## 2020-01-28 NOTE — Progress Notes (Signed)
Patient: Meredith Caldwell  Service Category: E/M  Provider: Gillis Santa, MD  DOB: 18-Feb-1946  DOS: 01/28/2020  Location: Office  MRN: [id number]  Setting: Ambulatory outpatient  Referring Provider: Baxter Hire, MD  Type: Established Patient  Specialty: Interventional Pain Management  PCP: Baxter Hire, MD  Location: Home  Delivery: TeleHealth     Virtual Encounter - Pain Management PROVIDER NOTE: Information contained herein reflects review and annotations entered in association with encounter. Interpretation of such information and data should be left to medically-trained personnel. Information provided to patient can be located elsewhere in the medical record under "Patient Instructions". Document created using STT-dictation technology, any transcriptional errors that may result from process are unintentional.    Contact & Pharmacy Preferred: [phone] Home: [phone] (home) Mobile: There is no such number on file (mobile). E-mail: No e-mail address on record  [facility], [place] [address] Phone: [phone] Fax: [phone]   Pre-screening  Meredith Caldwell offered "in-person" vs "virtual" encounter. She indicated preferring virtual for this encounter.   Reason COVID-19*  Social distancing based on CDC and AMA recommendations.   I contacted Meredith Caldwell on 01/28/2020 via video conference.      I clearly identified myself as Gillis Santa, MD. I verified that I was speaking with the correct person using two identifiers (Name: Meredith Caldwell, and date of birth: August 28, 1945).  Consent I sought verbal advanced consent from Meredith Caldwell for virtual visit interactions. I informed Meredith Caldwell of possible security and privacy concerns, risks, and limitations associated with providing "not-in-person" medical evaluation and management services. I also informed Meredith Caldwell  of the availability of "in-person" appointments. Finally, I informed her that there would be a charge for the virtual visit and that she could be  personally, fully or partially, financially responsible for it. Meredith Caldwell expressed understanding and agreed to proceed.   Historic Elements   Meredith Caldwell is a 74 y.o. year old, female patient evaluated today after our last contact on 01/04/2020. Meredith Caldwell  has a past medical history of Bone spur (2000), Diabetes ([place]) (2005), Edema (2000), Melanoma ([place]) (2008), Neuroma (2006), and Vaso vagal episode (1992). She also  has a past surgical history that includes Tonsillectomy and adenoidectomy (1950); Appendectomy (1960); Cholecystectomy (1972); Abdominal hysterectomy (1991); Breast surgery (1980); Excision/release bursa hip (Left, 1997); Liver biopsy (2005); Melanoma excision (Left, 2008); and Knee surgery (Right, 2013). Meredith Caldwell has a current medication list which includes the following prescription(s): albuterol, aspirin ec, cetirizine, colesevelam, diclofenac, fish oil-krill oil, furosemide, losartan, metformin, montelukast, multivitamin with minerals, nabumetone, naproxen sodium, paroxetine hcl, penciclovir, and zolpidem. She  reports that she quit smoking about 50 years ago. She has never used smokeless tobacco. She reports current alcohol use. She reports that she does not use drugs. Meredith Caldwell is allergic to statins, theophyllines, and zetia [ezetimibe].   HPI  Today, she is being contacted for a post-procedure assessment.   Post-Procedure Evaluation  Procedure (01/04/2020): b/l knee IA steroid injection #2 (#1 done July 2020) Sedation: Please see nurses note.  Effectiveness during initial hour after procedure(Ultra-Short Term Relief): 100% pain relief  Local anesthetic used: Long-acting (4-6 hours) Effectiveness: Defined as any analgesic benefit obtained secondary to the administration of local anesthetics. This carries  significant diagnostic value as to the etiological location, or anatomical origin, of the pain. Duration of benefit is expected to  coincide with the duration of the local anesthetic used.  Effectiveness during initial 4-6 hours after procedure(Short-Term Relief): 100% pain relief  Long-term benefit: Defined as any relief past the pharmacologic duration of the local anesthetics.  Effectiveness past the initial 6 hours after procedure(Long-Term Relief): 75% pain relief  Current benefits: Defined as benefit that persist at this time.   Analgesia:  >75% relief Function: Meredith Caldwell reports improvement in function ROM: Meredith Caldwell reports improvement in ROM   Laboratory Chemistry Profile   Renal No results found for: BUN, CREATININE, LABCREA, BCR, GFR, GFRAA, GFRNONAA, LABVMA, EPIRU, [id number], NOREPRU, NOREPI24HUR, DOPARU, [id number]   Hepatic No results found for: AST, ALT, ALBUMIN, ALKPHOS, HCVAB, AMYLASE, LIPASE, AMMONIA   Electrolytes No results found for: NA, K, CL, CALCIUM, MG, PHOS   Bone No results found for: VD25OH, [id number], [id number], CH4035CY8, 25OHVITD1, 25OHVITD2, 25OHVITD3, TESTOFREE, TESTOSTERONE   Inflammation (CRP: Acute Phase) (ESR: Chronic Phase) No results found for: CRP, ESRSEDRATE, LATICACIDVEN     Note: Above Lab results reviewed.   Assessment  The primary encounter diagnosis was Bilateral primary osteoarthritis of knee. Diagnoses of Chronic pain of left knee, Chronic pain of right knee, Chronic pain syndrome, Arthritis of left knee, Primary osteoarthritis of right knee, and Obesity, unspecified classification, unspecified obesity type, unspecified whether serious comorbidity present were also pertinent to this visit.  Plan of Care  Meredith Caldwell has a current medication list which includes the following long-term medication(s): albuterol, cetirizine, colesevelam, furosemide, losartan, metformin, montelukast, paroxetine hcl, and  zolpidem.  Recommend patient try knee unloader brace to help stabilize her knee.  Repeat bilateral knee intra-articular injection as needed.  Patient endorsing satisfactory pain relief, rated as approximately 75% after her last intra-articular knee steroid injection.  Orders:  Orders Placed This Encounter  Procedures  . KNEE INJECTION    For knee pain.    Standing Status:   Standing    Number of Occurrences:   2    Standing Expiration Date:   01/27/2021    Scheduling Instructions:     Side: Bilateral     Sedation: None     TIMEFRAME: PRN procedure. (Meredith Caldwell will call when needed.)    Order Specific Question:   Where will this procedure be performed?    Answer:   [facility]   Follow-up plan:   Return if symptoms worsen or fail to improve.     Significant benefit after intra-articular knee steroid injection #1 performed 11/2018, 01/04/2020, repeat as needed     Recent Visits Date Type Provider Dept  01/04/20 Procedure visit Gillis Santa, MD [facility]  12/28/19 Office Visit Gillis Santa, MD [facility]  Showing recent visits within past 90 days and meeting all other requirements Today's Visits Date Type Provider Dept  01/28/20 Telemedicine Gillis Santa, MD [facility]  Showing today's visits and meeting all other requirements Future Appointments No visits were found meeting these conditions. Showing future appointments within next 90 days and meeting all other requirements  I discussed the assessment and treatment plan with the patient. The patient was provided an opportunity to ask questions and all were answered. The patient agreed with the plan and demonstrated an understanding of the instructions.  Patient advised to call back or seek an in-person evaluation if the symptoms or condition worsens.  Duration of encounter: 18 minutes.  Note by: Gillis Santa, MD Date: 01/28/2020; Time: 1:59 PM

## 2020-02-01 ENCOUNTER — Telehealth
Payer: Federal, State, Local not specified - PPO | Admitting: Student in an Organized Health Care Education/Training Program

## 2020-02-08 ENCOUNTER — Encounter: Payer: Self-pay | Admitting: *Deleted

## 2020-02-08 ENCOUNTER — Telehealth: Payer: Self-pay | Admitting: Student in an Organized Health Care Education/Training Program

## 2020-02-08 NOTE — Telephone Encounter (Signed)
Pt called stating that Dr Holley Raring told her to get a brace for her knee. She states she would like something written from him stating that she needs this so that insurance will pay for it.

## 2020-02-08 NOTE — Telephone Encounter (Signed)
Recommend patient consider bracing for right knee given right knee osteoarthritis and associated instability at times.

## 2020-02-09 ENCOUNTER — Encounter: Payer: Self-pay | Admitting: *Deleted

## 2020-02-09 NOTE — Telephone Encounter (Signed)
Patient notified letter has been written. She requests the letter be mailed to her home. Done.

## 2021-02-01 ENCOUNTER — Emergency Department
Admission: EM | Admit: 2021-02-01 | Discharge: 2021-02-01 | Disposition: A | Discharge status: Home / Self Care | Payer: Federal, State, Local not specified - PPO | Attending: Emergency Medicine | Admitting: Emergency Medicine

## 2021-02-01 ENCOUNTER — Other Ambulatory Visit: Payer: Self-pay

## 2021-02-01 ENCOUNTER — Encounter: Payer: Self-pay | Admitting: Emergency Medicine

## 2021-02-01 DIAGNOSIS — R42 Dizziness and giddiness: Secondary | ICD-10-CM | POA: Diagnosis not present

## 2021-02-01 DIAGNOSIS — E119 Type 2 diabetes mellitus without complications: Secondary | ICD-10-CM | POA: Diagnosis not present

## 2021-02-01 DIAGNOSIS — F17210 Nicotine dependence, cigarettes, uncomplicated: Secondary | ICD-10-CM | POA: Diagnosis not present

## 2021-02-01 DIAGNOSIS — R0602 Shortness of breath: Secondary | ICD-10-CM | POA: Diagnosis not present

## 2021-02-01 DIAGNOSIS — Z7982 Long term (current) use of aspirin: Secondary | ICD-10-CM | POA: Insufficient documentation

## 2021-02-01 DIAGNOSIS — R55 Syncope and collapse: Secondary | ICD-10-CM | POA: Insufficient documentation

## 2021-02-01 DIAGNOSIS — Z85828 Personal history of other malignant neoplasm of skin: Secondary | ICD-10-CM | POA: Insufficient documentation

## 2021-02-01 DIAGNOSIS — Z7984 Long term (current) use of oral hypoglycemic drugs: Secondary | ICD-10-CM | POA: Diagnosis not present

## 2021-02-01 LAB — BASIC METABOLIC PANEL
Anion gap: 12 (ref 5–15)
BUN: 11 mg/dL (ref 8–23)
CO2: 30 mmol/L (ref 22–32)
Calcium: 10 mg/dL (ref 8.9–10.3)
Chloride: 97 mmol/L — ABNORMAL LOW (ref 98–111)
Creatinine, Ser: 0.65 mg/dL (ref 0.44–1.00)
GFR, Estimated: >60 mL/min (ref >60)
Glucose, Bld: 157 mg/dL — ABNORMAL HIGH (ref 70–99)
Potassium: 3.7 mmol/L (ref 3.5–5.1)
Sodium: 139 mmol/L (ref 135–145)

## 2021-02-01 LAB — URINALYSIS, COMPLETE (UACMP) WITH MICROSCOPIC
Bacteria, UA: NONE SEEN
Bilirubin Urine: NEGATIVE
Glucose, UA: NEGATIVE mg/dL
Hgb urine dipstick: NEGATIVE
Ketones, ur: NEGATIVE mg/dL
Nitrite: NEGATIVE
Protein, ur: NEGATIVE mg/dL
Specific Gravity, Urine: 1.013 (ref 1.005–1.030)
pH: 5 (ref 5.0–8.0)

## 2021-02-01 LAB — CBC
HCT: 41.5 % (ref 36.0–46.0)
Hemoglobin: 13.3 g/dL (ref 12.0–15.0)
MCH: 29.7 pg (ref 26.0–34.0)
MCHC: 32 g/dL (ref 30.0–36.0)
MCV: 92.6 fL (ref 80.0–100.0)
Platelets: 341 10*3/uL (ref 150–400)
RBC: 4.48 MIL/uL (ref 3.87–5.11)
RDW: 13.9 % (ref 11.5–15.5)
WBC: 7.4 10*3/uL (ref 4.0–10.5)
nRBC: 0 % (ref 0.0–0.2)

## 2021-02-01 LAB — TROPONIN I (HIGH SENSITIVITY): Troponin I (High Sensitivity): 8 ng/L (ref <18)

## 2021-02-01 LAB — BRAIN NATRIURETIC PEPTIDE: B Natriuretic Peptide: 51.4 pg/mL (ref 0.0–100.0)

## 2021-02-01 NOTE — ED Provider Notes (Signed)
Emergency Medicine Provider Triage Evaluation Note  Pebble Botkin , a 75 y.o. female  was evaluated in triage.  Pt complains of near syncope. She was standing at the buffet when she felt weak. She denies passing out or falling. She reports mild central chest pain and a mild headache.   Review of Systems  Positive: Near syncope, diaphoresis Negative: FCS  Physical Exam  BP (!) 147/89 (BP Location: Left Arm)   Pulse 73   Temp 98.3 F (36.8 C) (Oral)   Resp 16   Ht 5' 3.75" (1.619 m)   Wt 90.7 kg   SpO2 95%   BMI 34.59 kg/m  Gen:   Awake, no distress  anxious Resp:  Normal effort CTA MSK:   Moves extremities without difficulty  Other:  CVS: RRR  Medical Decision Making  Medically screening exam initiated at 4:08 PM.  Appropriate orders placed.  Mairin Lindsley was informed that the remainder of the evaluation will be completed by another provider, this initial triage assessment does not replace that evaluation, and the importance of remaining in the ED until their evaluation is complete.  Patient with ED evaluation of near syncope.    Melvenia Needles, PA-C 02/01/21 1615    Vladimir Crofts, MD 02/20/21 1037

## 2021-02-01 NOTE — ED Provider Notes (Signed)
[facility] Emergency Department Provider Note   ____________________________________________   Event Date/Time   First MD Initiated Contact with Patient 02/01/21 1836     (approximate)  I have reviewed the triage vital signs and the nursing notes.   HISTORY  Chief Complaint Dizziness    HPI Meredith Caldwell is a 75 y.o. female with past medical history of diabetes who presents to the ED complaining of dizziness.  Patient reports that since yesterday she has been dealing with intermittent episodes of suddenly feeling dizzy and lightheaded, like she is going to pass out.  She states she will feel very hot and sweaty with these episodes, subsequently has to sit down to prevent herself from losing consciousness.  She denies any associated chest pain or shortness of breath.  She has otherwise been feeling well with no fevers, cough, nausea, vomiting, diarrhea, abdominal pain, or dysuria.  She reports a history of vasovagal episodes in the past, but she has not had episodes like this in multiple years.  She went to see her PCP for this problem earlier today, but had an episode in the office and was referred to the ED.        Past Medical History:  Diagnosis Date   Bone spur 2000   right shoulder   Diabetes ([place]) 2005   Edema 2000   right arm   Melanoma ([place]) 2008   left arm   Neuroma 2006   Right ear   Vaso vagal episode 1992    Patient Active Problem List   Diagnosis Date Noted   Arthritis of left knee 05/22/2018   Obesity 04/03/2018   Diabetes mellitus type 2, uncomplicated ([place]) 01/77/9390   Chronic pain of left knee 04/03/2018   Chronic pain syndrome 04/03/2018   Primary osteoarthritis of right knee 01/16/2018    Past Surgical History:  Procedure Laterality Date   [place]   right chest, left brest   CHOLECYSTECTOMY  1972   EXCISION/RELEASE BURSA HIP Left 1997   KNEE  SURGERY Right 2013   LIVER BIOPSY  2005   MELANOMA EXCISION Left 2008   arm; cancerous   TONSILLECTOMY AND ADENOIDECTOMY  1950    Prior to Admission medications   Medication Sig Start Date End Date Taking? Authorizing Provider  albuterol (PROVENTIL HFA;VENTOLIN HFA) 108 (90 Base) MCG/ACT inhaler Inhale into the lungs.    [provider]  aspirin EC 81 MG tablet Take by mouth.    [provider]  cetirizine (ZYRTEC) 10 MG tablet Take 10 mg by mouth daily.    [provider]  colesevelam [facility]) 625 MG tablet Take by mouth. 03/27/18   [provider]  diclofenac (VOLTAREN) 75 MG EC tablet  07/30/18   [provider]  FISH OIL-KRILL OIL PO Take 360 mg by mouth.    [provider]  furosemide (LASIX) 40 MG tablet Take by mouth. 10/30/17   [provider]  losartan (COZAAR) 50 MG tablet take 1 tablet by mouth once daily 06/26/17   [provider]  metFORMIN (GLUCOPHAGE) 1000 MG tablet Take 1,000 mg by mouth 2 (two) times daily with a meal.    [provider]  montelukast (SINGULAIR) 10 MG tablet Take 10 mg by mouth at bedtime.    [provider]  Multiple Vitamin (MULTIVITAMIN WITH MINERALS) TABS tablet Take 1 tablet by mouth daily.    [provider]  nabumetone (RELAFEN) 500 MG tablet Take 500 mg by mouth daily.    [provider]  naproxen sodium (ALEVE) 220 MG tablet Take 220 mg by mouth daily as needed.     [provider]  PAROXETINE HCL ER PO Take 5 mg by mouth daily.     [provider]  penciclovir (DENAVIR) 1 % cream Apply 1 application topically every 2 (two) hours.    [provider]  zolpidem (AMBIEN) 5 MG tablet Take 5 mg by mouth at bedtime as needed for sleep.    [provider]    Allergies Statins, Theophyllines, and Zetia [ezetimibe]  No family history on file.  Social History Social History   Tobacco Use   Smoking status:  Former    Types: Cigarettes    Quit date: 04/03/1969    Years since quitting: 51.8   Smokeless tobacco: Never  Vaping Use   Vaping Use: Never used  Substance Use Topics   Alcohol use: Yes    Comment: "SOCIAL DRINKER"; ONCE A MONTH   Drug use: Never    Review of Systems  Constitutional: No fever/chills Eyes: No visual changes. ENT: No sore throat. Cardiovascular: Denies chest pain.  Positive for lightheadedness and near syncope. Respiratory: Denies shortness of breath. Gastrointestinal: No abdominal pain.  No nausea, no vomiting.  No diarrhea.  No constipation. Genitourinary: Negative for dysuria. Musculoskeletal: Negative for back pain. Skin: Negative for rash. Neurological: Negative for headaches, focal weakness or numbness.  ____________________________________________   PHYSICAL EXAM:  VITAL SIGNS: ED Triage Vitals  Enc Vitals Group     BP 02/01/21 1601 (!) 147/89     Pulse Rate 02/01/21 1601 73     Resp 02/01/21 1601 16     Temp 02/01/21 1601 98.3 F (36.8 C)     Temp Source 02/01/21 1601 Oral     SpO2 02/01/21 1601 95 %     Weight 02/01/21 1559 199 lb 15.3 oz (90.7 kg)     Height 02/01/21 1559 5' 3.75" (1.619 m)     Head Circumference --      Peak Flow --      Pain Score 02/01/21 1600 0     Pain Loc --      Pain Edu? --      Excl. in [place]? --     Constitutional: Alert and oriented. Eyes: Conjunctivae are normal. Head: Atraumatic. Nose: No congestion/rhinnorhea. Mouth/Throat: Mucous membranes are moist. Neck: Normal ROM Cardiovascular: Normal rate, regular rhythm. Grossly normal heart sounds.  2+ radial pulses bilaterally. Respiratory: Normal respiratory effort.  No retractions. Lungs CTAB. Gastrointestinal: Soft and nontender. No distention. Genitourinary: deferred Musculoskeletal: No lower extremity tenderness nor edema. Neurologic:  Normal speech and language. No gross focal neurologic deficits are appreciated. Skin:  Skin is warm, dry and intact.  No rash noted. Psychiatric: Mood and affect are normal. Speech and behavior are normal.  ____________________________________________   LABS (all labs ordered are listed, but only abnormal results are displayed)  Labs Reviewed  BASIC METABOLIC PANEL - Abnormal; Notable for the following components:      Result Value   Chloride 97 (*)    Glucose, Bld 157 (*)    All other components within normal limits  URINALYSIS, COMPLETE (UACMP) WITH MICROSCOPIC - Abnormal; Notable for the following components:   Color, Urine YELLOW (*)    APPearance CLEAR (*)    Leukocytes,Ua TRACE (*)    All other components within normal limits  CBC  BRAIN NATRIURETIC PEPTIDE  CBG MONITORING, ED  TROPONIN I (HIGH SENSITIVITY)   ____________________________________________  EKG  ED ECG REPORT I, Blake Divine, the attending physician, personally viewed and interpreted this ECG.   Date: 02/01/2021  EKG Time: 16:01  Rate: 71  Rhythm: normal sinus rhythm  Axis: Normal  Intervals:none  ST&T Change: None   PROCEDURES  Procedure(s) performed (including Critical Care):  Procedures   ____________________________________________   INITIAL IMPRESSION / ASSESSMENT AND PLAN / ED COURSE      75 year old female with past medical history of diabetes presents to the ED complaining of intermittent episodes of dizziness, lightheadedness, and feeling like she is going to pass out since yesterday.  She denies any chest pain or shortness of breath with these episodes, EKG shows no evidence of arrhythmia or ischemia and troponin is negative.  Remainder of labs are unremarkable, patient denies any infectious symptoms.  Episodes sound most consistent with vasovagal episodes, especially given her history.  Given reassuring work-up, she is appropriate for discharge home with PCP follow-up.  She was counseled to drink plenty of fluids and return to the ED for new worsening symptoms.  Patient agrees with plan.       ____________________________________________   FINAL CLINICAL IMPRESSION(S) / ED DIAGNOSES  Final diagnoses:  Near syncope  Vasovagal episode     ED Discharge Orders     None        Note:  This document was prepared using Dragon voice recognition software and may include unintentional dictation errors.    Blake Divine, MD 02/01/21 [phone]

## 2021-02-01 NOTE — ED Triage Notes (Signed)
C/O feeling a 'wash over' of weakness and while standing at the buffet, felt faint.  Had to sit down.  States with episode became diaphoretic and felt shaky.  Had another episode while at PCP's  AAOx3.  Skin warm and dry. MAE equally and strong.  NAD

## 2021-02-02 ENCOUNTER — Ambulatory Visit
Payer: Federal, State, Local not specified - PPO | Admitting: Student in an Organized Health Care Education/Training Program

## 2021-04-18 ENCOUNTER — Ambulatory Visit
Payer: Federal, State, Local not specified - PPO | Attending: Student in an Organized Health Care Education/Training Program | Admitting: Student in an Organized Health Care Education/Training Program

## 2021-04-18 ENCOUNTER — Other Ambulatory Visit: Payer: Self-pay

## 2021-04-18 ENCOUNTER — Encounter: Payer: Self-pay | Admitting: Student in an Organized Health Care Education/Training Program

## 2021-04-18 VITALS — BP 139/79 | HR 75 | Temp 97.2°F | Resp 16 | Ht 64.0 in | Wt 205.0 lb

## 2021-04-18 DIAGNOSIS — M25561 Pain in right knee: Secondary | ICD-10-CM | POA: Insufficient documentation

## 2021-04-18 DIAGNOSIS — G8929 Other chronic pain: Secondary | ICD-10-CM | POA: Insufficient documentation

## 2021-04-18 DIAGNOSIS — M25562 Pain in left knee: Secondary | ICD-10-CM | POA: Insufficient documentation

## 2021-04-18 DIAGNOSIS — G894 Chronic pain syndrome: Secondary | ICD-10-CM | POA: Diagnosis present

## 2021-04-18 DIAGNOSIS — M170 Bilateral primary osteoarthritis of knee: Secondary | ICD-10-CM | POA: Insufficient documentation

## 2021-04-18 NOTE — Progress Notes (Signed)
Safety precautions to be maintained throughout the outpatient stay will include: orient to surroundings, keep bed in low position, maintain call bell within reach at all times, provide assistance with transfer out of bed and ambulation.  

## 2021-04-18 NOTE — Patient Instructions (Signed)

## 2021-04-18 NOTE — Progress Notes (Signed)
PROVIDER NOTE: Information contained herein reflects review and annotations entered in association with encounter. Interpretation of such information and data should be left to medically-trained personnel. Information provided to patient can be located elsewhere in the medical record under "Patient Instructions". Document created using STT-dictation technology, any transcriptional errors that may result from process are unintentional.    Patient: Meredith Caldwell  Service Category: E/M  Provider: Gillis Santa, MD  DOB: [birth year]  DOS: 04/18/2021  Specialty: Interventional Pain Management  MRN: [id number]  Setting: Ambulatory outpatient  PCP: Baxter Hire, MD  Type: Established Patient    Referring Provider: Baxter Hire, MD  Location: Office  Delivery: Face-to-face     HPI  Meredith Caldwell, a 75 y.o. year old female, is here today because of her Bilateral primary osteoarthritis of knee [M17.0]. Meredith Caldwell primary complain today is Knee Pain Volanda Napoleon ) Last encounter: My last encounter with her was on 01/28/2020 Pertinent problems: Meredith Caldwell has Primary osteoarthritis of right knee; Diabetes mellitus type 2, uncomplicated ([place]); Chronic pain of left knee; Chronic pain syndrome; and Arthritis of left knee on their pertinent problem list. Pain Assessment: Severity of Chronic pain is reported as a 7 /10. Location: Knee Left, Right/denies. Onset: More than a month ago. Quality: Discomfort, Constant, Sharp. Timing: Constant. Modifying factor(s): elevating legs while sitting. Vitals:  height is _0  (1.626 m) and weight is 205 lb (93 kg). Her temporal temperature is 97.2 F (36.2 C) (abnormal). Her blood pressure is 139/79 and her pulse is 75. Her respiration is 16 and oxygen saturation is 98%.   Reason for encounter: worsening of previously known (established) problem   Increased bilateral knee pain status post bilateral intra-articular knee steroid injection on January 04, 2020.   This provided her with pain relief.  She would like to have this repeated. Will plan for b/l knee extended release steroid injection    ROS  Constitutional: Denies any fever or chills Gastrointestinal: No reported hemesis, hematochezia, vomiting, or acute GI distress Musculoskeletal:  Bilateral knee pain Neurological: No reported episodes of acute onset apraxia, aphasia, dysarthria, agnosia, amnesia, paralysis, loss of coordination, or loss of consciousness  Medication Review  Fish Oil-Krill Oil, PARoxetine HCl, albuterol, aspirin EC, cetirizine, colesevelam, cyanocobalamin, diclofenac, furosemide, losartan, metFORMIN, montelukast, multivitamin with minerals, nabumetone, naproxen sodium, penciclovir, and zolpidem  History Review  Allergy: Meredith Caldwell is allergic to statins, theophyllines, and zetia [ezetimibe]. Drug: Meredith Caldwell  reports no history of drug use. Alcohol:  reports current alcohol use. Tobacco:  reports that she quit smoking about 52 years ago. Her smoking use included cigarettes. She has never used smokeless tobacco. Social: Meredith Caldwell  reports that she quit smoking about 52 years ago. Her smoking use included cigarettes. She has never used smokeless tobacco. She reports current alcohol use. She reports that she does not use drugs. Medical:  has a past medical history of Bone spur (2000), Diabetes ([place]) (2005), Edema (2000), Melanoma ([place]) (2008), Neuroma (2006), and Vaso vagal episode (1992). Surgical: Meredith Caldwell  has a past surgical history that includes Tonsillectomy and adenoidectomy (1950); Appendectomy (1960); Cholecystectomy (1972); Abdominal hysterectomy (1991); Breast surgery (1980); Excision/release bursa hip (Left, 1997); Liver biopsy (2005); Melanoma excision (Left, 2008); and Knee surgery (Right, 2013). Family: family history is not on file.  Laboratory Chemistry Profile   Renal Lab Results  Component Value Date   BUN 11 02/01/2021   CREATININE  0.65 02/01/2021   GFRNONAA >60 02/01/2021    Hepatic No results  found for: AST, ALT, ALBUMIN, ALKPHOS, HCVAB, AMYLASE, LIPASE, AMMONIA  Electrolytes Lab Results  Component Value Date   NA 139 02/01/2021   K 3.7 02/01/2021   CL 97 (L) 02/01/2021   CALCIUM 10.0 02/01/2021    Bone No results found for: VD25OH, [id number], [id number], [id number], 25OHVITD1, 25OHVITD2, 25OHVITD3, TESTOFREE, TESTOSTERONE  Inflammation (CRP: Acute Phase) (ESR: Chronic Phase) No results found for: CRP, ESRSEDRATE, LATICACIDVEN       Note: Above Lab results reviewed.  CLINICAL DATA:  Diffuse right knee pain. History of fall in July, 2019. The patient underwent right knee surgery in 2013.   EXAM: MRI OF THE RIGHT KNEE WITHOUT CONTRAST   TECHNIQUE: Multiplanar, multisequence MR imaging of the knee was performed. No intravenous contrast was administered.   COMPARISON:  MRI right knee 12/22/2010.   FINDINGS: MENISCI   Medial meniscus: Radial tear at the root of the posterior horn of the medial meniscus is again seen. The remainder of the posterior horn is diminutive with blunting along its free edge likely related to prior surgery. The body is severely degenerated and extruded peripherally with a horizontal tear reaching the meniscal undersurface.   Lateral meniscus:  Intact.   LIGAMENTS   Cruciates:  Intact.   Collaterals:  Intact.   CARTILAGE   Patellofemoral: Diffuse, severe cartilage loss is again seen and appears somewhat worse than on the prior exam.   Medial:  Diffuse and severe cartilage loss is again seen.   Lateral:  No marked change in thinning and irregularity throughout.   Joint:  Small effusion.   Popliteal Fossa:  No Baker's cyst.   Extensor Mechanism:  Intact.   Bones: Tricompartmental osteophytosis has worsened since the prior examination, particularly on the medial side. No fracture or worrisome lesion.   Other: None.   IMPRESSION: Dominant finding is  advanced osteoarthritis about the knee appearing worst in the medial and patellofemoral compartments.   Radial tear at the root of the posterior of the medial meniscus is unchanged in appearance since the prior exam. The remainder of the posterior horn appears to have been debrided. Severely degenerated and diminutive body of the medial meniscus is extruded peripherally.  Physical Exam  General appearance: Well nourished, well developed, and well hydrated. In no apparent acute distress Mental status: Alert, oriented x 3 (person, place, & time)       Respiratory: No evidence of acute respiratory distress Eyes: PERLA Vitals: BP 139/79 (BP Location: Right Arm, Patient Position: Sitting, Cuff Size: Large)   Pulse 75   Temp (!) 97.2 F (36.2 C) (Temporal)   Resp 16   Ht _0  (1.626 m)   Wt 205 lb (93 kg)   SpO2 98%   BMI 35.19 kg/m  BMI: Estimated body mass index is 35.19 kg/m as calculated from the following:   Height as of this encounter: _1  (1.626 m).   Weight as of this encounter: 205 lb (93 kg). Ideal: Ideal body weight: 54.7 kg (120 lb 9.5 oz) Adjusted ideal body weight: 70 kg (154 lb 5.7 oz)  Bilateral knee pain, worse with weightbearing  Assessment   Status Diagnosis  Having a Flare-up Having a Flare-up Having a Flare-up 1. Bilateral primary osteoarthritis of knee   2. Chronic pain of left knee   3. Chronic pain of right knee   4. Chronic pain syndrome        Plan of Care   Orders:  Orders Placed This Encounter  Procedures   KNEE INJECTION  Local Anesthetic & Steroid injection.    Standing Status:   Future    Standing Expiration Date:   07/17/2021    Scheduling Instructions:     Side: Bilateral     Sedation: None     Timeframe: As soon as schedule allows    Order Specific Question:   Where will this procedure be performed?    Answer:   [facility]   Follow-up plan:   Return in about 8 days (around 04/26/2021) for B/L knee steroid (LA  steroid).     Significant benefit after intra-articular knee steroid injection #1 performed 11/2018, 01/04/2020, repeat as needed      Recent Visits No visits were found meeting these conditions. Showing recent visits within past 90 days and meeting all other requirements Today's Visits Date Type Provider Dept  04/18/21 Office Visit Gillis Santa, MD [facility]  Showing today's visits and meeting all other requirements Future Appointments No visits were found meeting these conditions. Showing future appointments within next 90 days and meeting all other requirements I discussed the assessment and treatment plan with the patient. The patient was provided an opportunity to ask questions and all were answered. The patient agreed with the plan and demonstrated an understanding of the instructions.  Patient advised to call back or seek an in-person evaluation if the symptoms or condition worsens.  Duration of encounter: 74mnutes.  Note by: BGillis Santa MD Date: 04/18/2021; Time: 2:54 PM

## 2021-04-19 ENCOUNTER — Ambulatory Visit
Payer: Federal, State, Local not specified - PPO | Admitting: Student in an Organized Health Care Education/Training Program

## 2021-05-01 ENCOUNTER — Ambulatory Visit
Payer: Federal, State, Local not specified - PPO | Attending: Student in an Organized Health Care Education/Training Program | Admitting: Student in an Organized Health Care Education/Training Program

## 2021-05-01 ENCOUNTER — Other Ambulatory Visit: Payer: Self-pay

## 2021-05-01 ENCOUNTER — Encounter: Payer: Self-pay | Admitting: Student in an Organized Health Care Education/Training Program

## 2021-05-01 VITALS — BP 98/66 | HR 77 | Temp 96.6°F | Resp 16 | Ht 63.75 in | Wt 205.0 lb

## 2021-05-01 DIAGNOSIS — M25562 Pain in left knee: Secondary | ICD-10-CM | POA: Diagnosis not present

## 2021-05-01 DIAGNOSIS — M170 Bilateral primary osteoarthritis of knee: Secondary | ICD-10-CM

## 2021-05-01 DIAGNOSIS — M25561 Pain in right knee: Secondary | ICD-10-CM | POA: Diagnosis not present

## 2021-05-01 DIAGNOSIS — G8929 Other chronic pain: Secondary | ICD-10-CM | POA: Diagnosis present

## 2021-05-01 DIAGNOSIS — G894 Chronic pain syndrome: Secondary | ICD-10-CM | POA: Diagnosis not present

## 2021-05-01 MED ORDER — LIDOCAINE HCL 2 % IJ SOLN
20.0000 mL | Freq: Once | INTRAMUSCULAR | Status: AC
  Administered 2021-05-01: 11:00:00 100 mg
  Filled 2021-05-01: qty 20

## 2021-05-01 MED ORDER — TRIAMCINOLONE ACETONIDE 32 MG IX SRER
32.0000 mg | Freq: Once | INTRA_ARTICULAR | Status: AC
  Administered 2021-05-01: 11:00:00 32 mg via INTRA_ARTICULAR
  Filled 2021-05-01: qty 5

## 2021-05-01 NOTE — Patient Instructions (Signed)

## 2021-05-01 NOTE — Progress Notes (Signed)
Safety precautions to be maintained throughout the outpatient stay will include: orient to surroundings, keep bed in low position, maintain call bell within reach at all times, provide assistance with transfer out of bed and ambulation.  

## 2021-05-01 NOTE — Progress Notes (Signed)
Patient's Name: Meredith Caldwell  MRN: [id number]  Referring Provider: Baxter Hire, MD  DOB: 01-27-1946  PCP: Baxter Hire, MD  DOS: 05/01/2021  Note by: Gillis Santa, MD  Service setting: Ambulatory outpatient  Specialty: Interventional Pain Management  Patient type: Established  Location: [facility] (AMB) Pain Management Facility  Visit type: Interventional Procedure   Primary Reason for Visit: Interventional Pain Management Treatment. CC: Knee Pain (bilat)  Procedure:          Anesthesia, Analgesia, Anxiolysis:  Type: Therapeutic Intra-Articular Local anesthetic and steroid - long acting Triamcinolone Knee Injection #3 (# 12 November 2018, #14 December 2019) Region: Medial infrapatellar Knee Region Level: Knee Joint Laterality: Bilateral  Type: Local Anesthesia Indication(s): Analgesia         Local Anesthetic: Lidocaine 1-2% Route: Infiltration (/IM) IV Access: Declined Sedation: Declined   Position: Sitting   Indications: 1. Bilateral primary osteoarthritis of knee   2. Chronic pain of left knee   3. Chronic pain of right knee   4. Chronic pain syndrome    Pain Score: Pre-procedure: 7 /10 Post-procedure: 0-No pain/10   Pre-op Assessment:  Meredith Caldwell is a 75 y.o. (year old), female patient, seen today for interventional treatment. She  has a past surgical history that includes Tonsillectomy and adenoidectomy (1950); Appendectomy (1960); Cholecystectomy (1972); Abdominal hysterectomy (1991); Breast surgery (1980); Excision/release bursa hip (Left, 1997); Liver biopsy (2005); Melanoma excision (Left, 2008); and Knee surgery (Right, 2013). Meredith Caldwell has a current medication list which includes the following prescription(s): albuterol, aspirin ec, cetirizine, colesevelam, cyanocobalamin, fish oil-krill oil, furosemide, losartan, montelukast, multivitamin with minerals, nabumetone, paroxetine hcl, penciclovir, zolpidem, diclofenac, metformin, and naproxen sodium. Her primarily  concern today is the Knee Pain (bilat)  Initial Vital Signs:  Pulse/HCG Rate: 77  Temp: (!) 96.6 F (35.9 C) Resp: 16 BP: 98/66 SpO2: 94 %  BMI: Estimated body mass index is 35.46 kg/m as calculated from the following:   Height as of this encounter: 5' 3.75" (1.619 m).   Weight as of this encounter: 205 lb (93 kg).  Risk Assessment: Allergies: Reviewed. She is allergic to statins, theophyllines, and zetia [ezetimibe].  Allergy Precautions: None required Coagulopathies: Reviewed. None identified.  Blood-thinner therapy: None at this time Active Infection(s): Reviewed. None identified. Meredith Caldwell is afebrile  Site Confirmation: Meredith Caldwell was asked to confirm the procedure and laterality before marking the site Procedure checklist: Completed Consent: Before the procedure and under the influence of no sedative(s), amnesic(s), or anxiolytics, the patient was informed of the treatment options, risks and possible complications. To fulfill our ethical and legal obligations, as recommended by the American Medical Association's Code of Ethics, I have informed the patient of my clinical impression; the nature and purpose of the treatment or procedure; the risks, benefits, and possible complications of the intervention; the alternatives, including doing nothing; the risk(s) and benefit(s) of the alternative treatment(s) or procedure(s); and the risk(s) and benefit(s) of doing nothing. The patient was provided information about the general risks and possible complications associated with the procedure. These may include, but are not limited to: failure to achieve desired goals, infection, bleeding, organ or nerve damage, allergic reactions, paralysis, and death. In addition, the patient was informed of those risks and complications associated to the procedure, such as failure to decrease pain; infection; bleeding; organ or nerve damage with subsequent damage to sensory, motor, and/or autonomic  systems, resulting in permanent pain, numbness, and/or weakness of one or several areas of the body; allergic  reactions; (i.e.: anaphylactic reaction); and/or death. Furthermore, the patient was informed of those risks and complications associated with the medications. These include, but are not limited to: allergic reactions (i.e.: anaphylactic or anaphylactoid reaction(s)); adrenal axis suppression; blood sugar elevation that in diabetics may result in ketoacidosis or comma; water retention that in patients with history of congestive heart failure may result in shortness of breath, pulmonary edema, and decompensation with resultant heart failure; weight gain; swelling or edema; medication-induced neural toxicity; particulate matter embolism and blood vessel occlusion with resultant organ, and/or nervous system infarction; and/or aseptic necrosis of one or more joints. Finally, the patient was informed that Medicine is not an exact science; therefore, there is also the possibility of unforeseen or unpredictable risks and/or possible complications that may result in a catastrophic outcome. The patient indicated having understood very clearly. We have given the patient no guarantees and we have made no promises. Enough time was given to the patient to ask questions, all of which were answered to the patient's satisfaction. Meredith Caldwell has indicated that she wanted to continue with the procedure. Attestation: I, the ordering provider, attest that I have discussed with the patient the benefits, risks, side-effects, alternatives, likelihood of achieving goals, and potential problems during recovery for the procedure that I have provided informed consent. Date   Time: 05/01/2021 10:43 AM  Pre-Procedure Preparation:  Monitoring: As per clinic protocol. Respiration, ETCO2, SpO2, BP, heart rate and rhythm monitor placed and checked for adequate function Safety Precautions: Patient was assessed for positional  comfort and pressure points before starting the procedure. Time-out: I initiated and conducted the "Time-out" before starting the procedure, as per protocol. The patient was asked to participate by confirming the accuracy of the "Time Out" information. Verification of the correct person, site, and procedure were performed and confirmed by me, the nursing staff, and the patient. "Time-out" conducted as per Joint Commission's Universal Protocol (UP.01.01.01). Time: 1114  Description of Procedure:          Target Area: Knee Joint Approach: Just above the Lateral tibial plateau, lateral to the infrapatellar tendon. Area Prepped: Entire knee area, from the mid-thigh to the mid-shin. Prepping solution: DuraPrep (Iodine Povacrylex [0.7% available iodine] and Isopropyl Alcohol, 74% w/w) Safety Precautions: Aspiration looking for blood return was conducted prior to all injections. At no point did we inject any substances, as a needle was being advanced. No attempts were made at seeking any paresthesias. Safe injection practices and needle disposal techniques used. Medications properly checked for expiration dates. SDV (single dose vial) medications used. Description of the Procedure: Protocol guidelines were followed. The patient was placed in position over the fluoroscopy table. The target area was identified and the area prepped in the usual manner. Skin & deeper tissues infiltrated with local anesthetic. Appropriate amount of time allowed to pass for local anesthetics to take effect. The procedure needles were then advanced to the target area. Proper needle placement secured. Negative aspiration confirmed. Solution injected in intermittent fashion, asking for systemic symptoms every 0.5cc of injectate. The needles were then removed and the area cleansed, making sure to leave some of the prepping solution back to take advantage of its long term bactericidal properties. Vitals:   05/01/21 1055  BP: 98/66   Pulse: 77  Resp: 16  Temp: (!) 96.6 F (35.9 C)  TempSrc: Temporal  SpO2: 94%  Weight: 205 lb (93 kg)  Height: 5' 3.75" (1.619 m)    Start Time: 1114 hrs. End Time: 1118 hrs.  Materials:  Needle(s) Type: Regular needle Gauge: 25G Length: 1.5-in Medication(s): Please see orders for medications and dosing details. 32 mg Triamcinolone injected into each knee after 2% local- Lidocaine  Post-operative Assessment:  Post-procedure Vital Signs:  Pulse/HCG Rate: 77  Temp: (!) 96.6 F (35.9 C) Resp: 16 BP: 98/66 SpO2: 94 %  EBL: None  Complications: No immediate post-treatment complications observed by team, or reported by patient.  Note: The patient tolerated the entire procedure well. A repeat set of vitals were taken after the procedure and the patient was kept under observation following institutional policy, for this type of procedure. Post-procedural neurological assessment was performed, showing return to baseline, prior to discharge. The patient was provided with post-procedure discharge instructions, including a section on how to identify potential problems. Should any problems arise concerning this procedure, the patient was given instructions to immediately contact us, at any time, without hesitation. In any case, we plan to contact the patient by telephone for a follow-up status report regarding this interventional procedure.  Comments:  No additional relevant information.  Plan of Care   Medications ordered for procedure: Meds ordered this encounter  Medications   Triamcinolone Acetonide SRER 32 mg    Maintain refrigerated.  Prepared suspension may be stored up to 4 hours at ambient conditions.   Triamcinolone Acetonide SRER 32 mg    Maintain refrigerated.  Prepared suspension may be stored up to 4 hours at ambient conditions.   lidocaine (XYLOCAINE) 2 % (with pres) injection 400 mg   Medications administered: We administered Triamcinolone Acetonide, Triamcinolone  Acetonide, and lidocaine.  See the medical record for exact dosing, route, and time of administration.  Follow-up plan:   Return in about 8 weeks (around 06/26/2021) for pp  vv.     Recent Visits Date Type Provider Dept  04/18/21 Office Visit Gillis Santa, MD [facility]  Showing recent visits within past 90 days and meeting all other requirements Today's Visits Date Type Provider Dept  05/01/21 Procedure visit Gillis Santa, MD [facility]  Showing today's visits and meeting all other requirements Future Appointments Date Type Provider Dept  06/26/21 Appointment Gillis Santa, MD [facility]  Showing future appointments within next 90 days and meeting all other requirements  Disposition: Discharge home  Discharge Date & Time: 05/01/2021; 1125 hrs.   Primary Care Physician: Baxter Hire, MD Location: [facility] Note by: Gillis Santa, MD Date: 05/01/2021; Time: 11:42 AM  Disclaimer:  Medicine is not an exact science. The only guarantee in medicine is that nothing is guaranteed. It is important to note that the decision to proceed with this intervention was based on the information collected from the patient. The Data and conclusions were drawn from the patient's questionnaire, the interview, and the physical examination. Because the information was provided in large part by the patient, it cannot be guaranteed that it has not been purposely or unconsciously manipulated. Every effort has been made to obtain as much relevant data as possible for this evaluation. It is important to note that the conclusions that lead to this procedure are derived in large part from the available data. Always take into account that the treatment will also be dependent on availability of resources and existing treatment guidelines, considered by other Pain Management Practitioners as being common knowledge and practice, at the time of the  intervention. For Medico-Legal purposes, it is also important to point out that variation in procedural techniques and pharmacological choices are  the acceptable norm. The indications, contraindications, technique, and results of the above procedure should only be interpreted and judged by a Board-Certified Interventional Pain Specialist with extensive familiarity and expertise in the same exact procedure and technique.

## 2021-05-02 ENCOUNTER — Telehealth: Payer: Self-pay

## 2021-05-02 NOTE — Telephone Encounter (Signed)
Post procedure phone call.  LM 

## 2021-06-21 ENCOUNTER — Other Ambulatory Visit: Payer: Self-pay

## 2021-06-21 ENCOUNTER — Ambulatory Visit
Payer: Federal, State, Local not specified - PPO | Attending: Student in an Organized Health Care Education/Training Program | Admitting: Student in an Organized Health Care Education/Training Program

## 2021-06-21 ENCOUNTER — Encounter: Payer: Self-pay | Admitting: Student in an Organized Health Care Education/Training Program

## 2021-06-21 DIAGNOSIS — M25562 Pain in left knee: Secondary | ICD-10-CM | POA: Diagnosis not present

## 2021-06-21 DIAGNOSIS — G894 Chronic pain syndrome: Secondary | ICD-10-CM

## 2021-06-21 DIAGNOSIS — M170 Bilateral primary osteoarthritis of knee: Secondary | ICD-10-CM

## 2021-06-21 DIAGNOSIS — M25561 Pain in right knee: Secondary | ICD-10-CM

## 2021-06-21 DIAGNOSIS — G8929 Other chronic pain: Secondary | ICD-10-CM

## 2021-06-21 NOTE — Progress Notes (Signed)
Patient: Meredith Caldwell  Service Category: E/M  Provider: Gillis Santa, MD  DOB: [birth year]  DOS: 06/21/2021  Location: Office  MRN: [id number]  Setting: Ambulatory outpatient  Referring Provider: Baxter Hire, MD  Type: Established Patient  Specialty: Interventional Pain Management  PCP: Baxter Hire, MD  Location: Remote location  Delivery: TeleHealth     Virtual Encounter - Pain Management PROVIDER NOTE: Information contained herein reflects review and annotations entered in association with encounter. Interpretation of such information and data should be left to medically-trained personnel. Information provided to patient can be located elsewhere in the medical record under "Patient Instructions". Document created using STT-dictation technology, any transcriptional errors that may result from process are unintentional.    Contact & Pharmacy Preferred: [phone] Home: [phone] (home) Mobile: There is no such number on file (mobile). E-mail: No e-mail address on record  [facility] [place] [address] Phone: [phone] Fax: [phone]   Pre-screening  Meredith Caldwell offered "in-person" vs "virtual" encounter. She indicated preferring virtual for this encounter.   Reason COVID-19*   Social distancing based on CDC and AMA recommendations.   I contacted Meredith Caldwell on 06/21/2021 via telephone.      I clearly identified myself as Gillis Santa, MD. I verified that I was speaking with the correct person using two identifiers (Name: Meredith Caldwell, and date of birth: Meredith Caldwell).  Consent I sought verbal advanced consent from Meredith Caldwell for virtual visit interactions. I informed Meredith Caldwell of possible security and privacy concerns, risks, and limitations associated with providing "not-in-person" medical evaluation and management services. I also informed Meredith Caldwell  of the availability of "in-person" appointments. Finally, I informed her that there would be a charge for the virtual visit and that she could be  personally, fully or partially, financially responsible for it. Meredith Caldwell expressed understanding and agreed to proceed.   Historic Elements   Meredith Caldwell is a 76 y.o. year old, female patient evaluated today after our last contact on 05/01/2021. Meredith Caldwell  has a past medical history of Bone spur (2000), Diabetes ([place]) (2005), Edema (2000), Melanoma ([place]) (2008), Neuroma (2006), and Vaso vagal episode (1992). She also  has a past surgical history that includes Tonsillectomy and adenoidectomy (1950); Appendectomy (1960); Cholecystectomy (1972); Abdominal hysterectomy (1991); Breast surgery (1980); Excision/release bursa hip (Left, 1997); Liver biopsy (2005); Melanoma excision (Left, 2008); and Knee surgery (Right, 2013). Meredith Caldwell has a current medication list which includes the following prescription(s): albuterol, aspirin ec, cetirizine, colesevelam, cyanocobalamin, diclofenac, fish oil-krill oil, furosemide, losartan, metformin, montelukast, multivitamin with minerals, nabumetone, naproxen sodium, paroxetine hcl, penciclovir, and zolpidem. She  reports that she quit smoking about 52 years ago. Her smoking use included cigarettes. She has never used smokeless tobacco. She reports current alcohol use. She reports that she does not use drugs. Meredith Caldwell is allergic to statins, theophyllines, and zetia [ezetimibe].   HPI  Today, she is being contacted for a post-procedure assessment.   Post-procedure evaluation   Type: Therapeutic Intra-Articular Local anesthetic and steroid - long acting Triamcinolone Knee Injection #3 (# 12 November 2018, #14 December 2019) Region: Medial infrapatellar Knee Region Level: Knee Joint Laterality: Bilateral  Effectiveness:  Initial hour after procedure: 100 %  Subsequent 4-6 hours post-procedure: 100 %   Analgesia past initial 6 hours: 90 %  Ongoing improvement:  Analgesic:  90% up until  1.5 weeks ago when she started to notice increased pain weight Function: Meredith Caldwell reports improvement in function ROM: Meredith Caldwell reports improvement in ROM   Laboratory Chemistry Profile   Renal Lab Results  Component Value Date   BUN 11 02/01/2021   CREATININE 0.65 02/01/2021   GFRNONAA >60 02/01/2021    Hepatic No results found for: AST, ALT, ALBUMIN, ALKPHOS, HCVAB, AMYLASE, LIPASE, AMMONIA  Electrolytes Lab Results  Component Value Date   NA 139 02/01/2021   K 3.7 02/01/2021   CL 97 (L) 02/01/2021   CALCIUM 10.0 02/01/2021    Bone No results found for: VD25OH, [id number], AO1308MV7, QI6962XB2, 25OHVITD1, 25OHVITD2, 25OHVITD3, TESTOFREE, TESTOSTERONE  Inflammation (CRP: Acute Phase) (ESR: Chronic Phase) No results found for: CRP, ESRSEDRATE, LATICACIDVEN       Note: Above Lab results reviewed.  Assessment  The primary encounter diagnosis was Bilateral primary osteoarthritis of knee. Diagnoses of Chronic pain of left knee, Chronic pain of right knee, and Chronic pain syndrome were also pertinent to this visit.  Plan of Care    Orders:  Orders Placed This Encounter  Procedures   KNEE INJECTION    Local Anesthetic & Steroid injection.    Standing Status:   Standing    Number of Occurrences:   2    Standing Expiration Date:   12/19/2021    Scheduling Instructions:     Side: Bilateral ER Triamcinolone     PRN    Order Specific Question:   Where will this procedure be performed?    Answer:   [facility]   Follow-up plan:   Return for PRN REPEAT IA knee steroid.     Significant benefit after intra-articular knee steroid injection performed 11/2018, 01/04/2020, 05/01/21 repeat as needed       Recent Visits Date Type Provider Dept  05/01/21 Procedure visit Gillis Santa, MD [facility]  04/18/21 Office Visit Gillis Santa, MD [facility]   Showing recent visits within past 90 days and meeting all other requirements Today's Visits Date Type Provider Dept  06/21/21 Office Visit Gillis Santa, MD [facility]  Showing today's visits and meeting all other requirements Future Appointments No visits were found meeting these conditions. Showing future appointments within next 90 days and meeting all other requirements  I discussed the assessment and treatment plan with the patient. The patient was provided an opportunity to ask questions and all were answered. The patient agreed with the plan and demonstrated an understanding of the instructions.  Patient advised to call back or seek an in-person evaluation if the symptoms or condition worsens.  Duration of encounter: 58mnutes.  Note by: BGillis Santa MD Date: 06/21/2021; Time: 2:36 PM

## 2021-06-26 ENCOUNTER — Telehealth
Payer: Federal, State, Local not specified - PPO | Admitting: Student in an Organized Health Care Education/Training Program

## 2021-11-17 ENCOUNTER — Emergency Department: Payer: Medicare Other

## 2021-11-17 ENCOUNTER — Inpatient Hospital Stay
Admission: EM | Admit: 2021-11-17 | Discharge: 2021-11-20 | DRG: 177 | Disposition: A | Discharge status: Home Health | Payer: Medicare Other | Attending: Internal Medicine | Admitting: Internal Medicine

## 2021-11-17 ENCOUNTER — Other Ambulatory Visit: Payer: Self-pay

## 2021-11-17 DIAGNOSIS — Z79899 Other long term (current) drug therapy: Secondary | ICD-10-CM

## 2021-11-17 DIAGNOSIS — J9601 Acute respiratory failure with hypoxia: Secondary | ICD-10-CM | POA: Diagnosis not present

## 2021-11-17 DIAGNOSIS — J45901 Unspecified asthma with (acute) exacerbation: Secondary | ICD-10-CM | POA: Diagnosis present

## 2021-11-17 DIAGNOSIS — Z90710 Acquired absence of both cervix and uterus: Secondary | ICD-10-CM

## 2021-11-17 DIAGNOSIS — R5381 Other malaise: Secondary | ICD-10-CM | POA: Diagnosis present

## 2021-11-17 DIAGNOSIS — F39 Unspecified mood [affective] disorder: Secondary | ICD-10-CM | POA: Diagnosis present

## 2021-11-17 DIAGNOSIS — U071 COVID-19: Principal | ICD-10-CM | POA: Diagnosis present

## 2021-11-17 DIAGNOSIS — E785 Hyperlipidemia, unspecified: Secondary | ICD-10-CM | POA: Diagnosis present

## 2021-11-17 DIAGNOSIS — Z9049 Acquired absence of other specified parts of digestive tract: Secondary | ICD-10-CM

## 2021-11-17 DIAGNOSIS — G4700 Insomnia, unspecified: Secondary | ICD-10-CM | POA: Diagnosis present

## 2021-11-17 DIAGNOSIS — I10 Essential (primary) hypertension: Secondary | ICD-10-CM | POA: Diagnosis present

## 2021-11-17 DIAGNOSIS — Z888 Allergy status to other drugs, medicaments and biological substances status: Secondary | ICD-10-CM

## 2021-11-17 DIAGNOSIS — R0602 Shortness of breath: Secondary | ICD-10-CM | POA: Diagnosis not present

## 2021-11-17 DIAGNOSIS — E872 Acidosis, unspecified: Secondary | ICD-10-CM | POA: Diagnosis present

## 2021-11-17 DIAGNOSIS — E1165 Type 2 diabetes mellitus with hyperglycemia: Secondary | ICD-10-CM | POA: Diagnosis present

## 2021-11-17 DIAGNOSIS — E669 Obesity, unspecified: Secondary | ICD-10-CM | POA: Diagnosis present

## 2021-11-17 DIAGNOSIS — J1282 Pneumonia due to coronavirus disease 2019: Secondary | ICD-10-CM | POA: Diagnosis present

## 2021-11-17 DIAGNOSIS — I110 Hypertensive heart disease with heart failure: Secondary | ICD-10-CM | POA: Diagnosis present

## 2021-11-17 DIAGNOSIS — Z6838 Body mass index (BMI) 38.0-38.9, adult: Secondary | ICD-10-CM

## 2021-11-17 DIAGNOSIS — Z87891 Personal history of nicotine dependence: Secondary | ICD-10-CM

## 2021-11-17 DIAGNOSIS — Z85820 Personal history of malignant melanoma of skin: Secondary | ICD-10-CM

## 2021-11-17 DIAGNOSIS — I5032 Chronic diastolic (congestive) heart failure: Secondary | ICD-10-CM | POA: Diagnosis present

## 2021-11-17 DIAGNOSIS — E119 Type 2 diabetes mellitus without complications: Secondary | ICD-10-CM

## 2021-11-17 LAB — COMPREHENSIVE METABOLIC PANEL
ALT: 57 U/L — ABNORMAL HIGH (ref 0–44)
AST: 51 U/L — ABNORMAL HIGH (ref 15–41)
Albumin: 4.3 g/dL (ref 3.5–5.0)
Alkaline Phosphatase: 54 U/L (ref 38–126)
Anion gap: 15 (ref 5–15)
BUN: 12 mg/dL (ref 8–23)
CO2: 24 mmol/L (ref 22–32)
Calcium: 9.6 mg/dL (ref 8.9–10.3)
Chloride: 100 mmol/L (ref 98–111)
Creatinine, Ser: 0.72 mg/dL (ref 0.44–1.00)
GFR, Estimated: >60 mL/min (ref >60)
Glucose, Bld: 180 mg/dL — ABNORMAL HIGH (ref 70–99)
Potassium: 3.9 mmol/L (ref 3.5–5.1)
Sodium: 139 mmol/L (ref 135–145)
Total Bilirubin: 0.7 mg/dL (ref 0.3–1.2)
Total Protein: 7.5 g/dL (ref 6.5–8.1)

## 2021-11-17 LAB — CBC WITH DIFFERENTIAL/PLATELET
Abs Immature Granulocytes: 0.02 10*3/uL (ref 0.00–0.07)
Basophils Absolute: 0 10*3/uL (ref 0.0–0.1)
Basophils Relative: 0 %
Eosinophils Absolute: 0.1 10*3/uL (ref 0.0–0.5)
Eosinophils Relative: 1 %
HCT: 42 % (ref 36.0–46.0)
Hemoglobin: 13.4 g/dL (ref 12.0–15.0)
Immature Granulocytes: 0 %
Lymphocytes Relative: 15 %
Lymphs Abs: 1.2 10*3/uL (ref 0.7–4.0)
MCH: 29.5 pg (ref 26.0–34.0)
MCHC: 31.9 g/dL (ref 30.0–36.0)
MCV: 92.5 fL (ref 80.0–100.0)
Monocytes Absolute: 0.8 10*3/uL (ref 0.1–1.0)
Monocytes Relative: 11 %
Neutro Abs: 5.8 10*3/uL (ref 1.7–7.7)
Neutrophils Relative %: 73 %
Platelets: 256 10*3/uL (ref 150–400)
RBC: 4.54 MIL/uL (ref 3.87–5.11)
RDW: 14.4 % (ref 11.5–15.5)
WBC: 8 10*3/uL (ref 4.0–10.5)
nRBC: 0 % (ref 0.0–0.2)

## 2021-11-17 LAB — SARS CORONAVIRUS 2 BY RT PCR: SARS Coronavirus 2 by RT PCR: POSITIVE — AB

## 2021-11-17 LAB — D-DIMER, QUANTITATIVE: D-Dimer, Quant: 0.5 ug/mL-FEU (ref 0.00–0.50)

## 2021-11-17 LAB — PROCALCITONIN: Procalcitonin: <0.1 ng/mL

## 2021-11-17 LAB — LACTIC ACID, PLASMA
Lactic Acid, Venous: 2.6 mmol/L (ref 0.5–1.9)
Lactic Acid, Venous: 2.2 mmol/L (ref 0.5–1.9)

## 2021-11-17 MED ORDER — ASPIRIN 81 MG PO TBEC
81.0000 mg | DELAYED_RELEASE_TABLET | Freq: Every day | ORAL | Status: DC
  Administered 2021-11-17 – 2021-11-20 (×4): 81 mg via ORAL
  Filled 2021-11-17 (×4): qty 1

## 2021-11-17 MED ORDER — ONDANSETRON HCL 4 MG PO TABS: 4.0000 mg | ORAL_TABLET | Freq: Four times a day (QID) | ORAL | Status: DC | PRN

## 2021-11-17 MED ORDER — SODIUM CHLORIDE 0.9 % IV BOLUS
1000.0000 mL | Freq: Once | INTRAVENOUS | Status: AC
  Administered 2021-11-17: 1000 mL via INTRAVENOUS

## 2021-11-17 MED ORDER — IBUPROFEN 800 MG PO TABS
800.0000 mg | ORAL_TABLET | Freq: Once | ORAL | Status: AC
Start: 2021-11-17 — End: 2021-11-17
  Administered 2021-11-17: 800 mg via ORAL
  Filled 2021-11-17: qty 1

## 2021-11-17 MED ORDER — COLESEVELAM HCL 625 MG PO TABS
625.0000 mg | ORAL_TABLET | Freq: Every day | ORAL | Status: DC
  Administered 2021-11-18 – 2021-11-20 (×3): 625 mg via ORAL
  Filled 2021-11-17 (×3): qty 1

## 2021-11-17 MED ORDER — METHYLPREDNISOLONE SODIUM SUCC 125 MG IJ SOLR
125.0000 mg | Freq: Once | INTRAMUSCULAR | Status: AC
  Administered 2021-11-17: 125 mg via INTRAVENOUS
  Filled 2021-11-17: qty 2

## 2021-11-17 MED ORDER — FUROSEMIDE 40 MG PO TABS
40.0000 mg | ORAL_TABLET | Freq: Every day | ORAL | Status: DC
  Administered 2021-11-19 – 2021-11-20 (×2): 40 mg via ORAL
  Filled 2021-11-17 (×2): qty 1

## 2021-11-17 MED ORDER — ENOXAPARIN SODIUM 40 MG/0.4ML IJ SOSY
40.0000 mg | PREFILLED_SYRINGE | INTRAMUSCULAR | Status: DC
  Filled 2021-11-17 (×2): qty 0.4

## 2021-11-17 MED ORDER — GUAIFENESIN-DM 100-10 MG/5ML PO SYRP: 10.0000 mL | ORAL_SOLUTION | ORAL | Status: DC | PRN

## 2021-11-17 MED ORDER — ACETAMINOPHEN 500 MG PO TABS: 1000.0000 mg | ORAL_TABLET | Freq: Once | ORAL | Status: DC

## 2021-11-17 MED ORDER — HYDROCOD POLI-CHLORPHE POLI ER 10-8 MG/5ML PO SUER
5.0000 mL | Freq: Two times a day (BID) | ORAL | Status: DC | PRN
  Administered 2021-11-17 – 2021-11-20 (×5): 5 mL via ORAL
  Filled 2021-11-17 (×5): qty 5

## 2021-11-17 MED ORDER — PREDNISONE 20 MG PO TABS: 50.0000 mg | ORAL_TABLET | Freq: Every day | ORAL | Status: DC

## 2021-11-17 MED ORDER — ALBUTEROL SULFATE (2.5 MG/3ML) 0.083% IN NEBU
3.0000 mL | INHALATION_SOLUTION | Freq: Once | RESPIRATORY_TRACT | Status: AC
  Administered 2021-11-17: 3 mL via RESPIRATORY_TRACT
  Filled 2021-11-17: qty 3

## 2021-11-17 MED ORDER — ZOLPIDEM TARTRATE 5 MG PO TABS
5.0000 mg | ORAL_TABLET | Freq: Every evening | ORAL | Status: DC | PRN
Start: 2021-11-17 — End: 2021-11-20
  Administered 2021-11-17 – 2021-11-19 (×2): 5 mg via ORAL
  Filled 2021-11-17 (×2): qty 1

## 2021-11-17 MED ORDER — IPRATROPIUM-ALBUTEROL 20-100 MCG/ACT IN AERS
1.0000 | INHALATION_SPRAY | Freq: Four times a day (QID) | RESPIRATORY_TRACT | Status: DC
Start: 2021-11-17 — End: 2021-11-20
  Administered 2021-11-17 – 2021-11-20 (×10): 1 via RESPIRATORY_TRACT
  Filled 2021-11-17: qty 4

## 2021-11-17 MED ORDER — ZINC SULFATE 220 (50 ZN) MG PO CAPS
220.0000 mg | ORAL_CAPSULE | Freq: Every day | ORAL | Status: DC
  Administered 2021-11-17 – 2021-11-20 (×4): 220 mg via ORAL
  Filled 2021-11-17 (×4): qty 1

## 2021-11-17 MED ORDER — SALINE SPRAY 0.65 % NA SOLN
1.0000 | NASAL | Status: DC
  Administered 2021-11-17 – 2021-11-18 (×9): 2 via NASAL
  Administered 2021-11-19: 1 via NASAL
  Administered 2021-11-19 (×4): 2 via NASAL
  Administered 2021-11-19 (×2): 1 via NASAL
  Administered 2021-11-19 (×4): 2 via NASAL
  Filled 2021-11-17: qty 44

## 2021-11-17 MED ORDER — DOCUSATE SODIUM 100 MG PO CAPS
100.0000 mg | ORAL_CAPSULE | Freq: Two times a day (BID) | ORAL | Status: DC
  Administered 2021-11-17 – 2021-11-20 (×6): 100 mg via ORAL
  Filled 2021-11-17 (×6): qty 1

## 2021-11-17 MED ORDER — DM-GUAIFENESIN ER 30-600 MG PO TB12
1.0000 | ORAL_TABLET | Freq: Two times a day (BID) | ORAL | Status: DC
  Administered 2021-11-17 – 2021-11-20 (×6): 1 via ORAL
  Filled 2021-11-17 (×6): qty 1

## 2021-11-17 MED ORDER — BENZONATATE 100 MG PO CAPS
100.0000 mg | ORAL_CAPSULE | Freq: Three times a day (TID) | ORAL | Status: DC
  Administered 2021-11-17 – 2021-11-20 (×9): 100 mg via ORAL
  Filled 2021-11-17 (×9): qty 1

## 2021-11-17 MED ORDER — ACETAMINOPHEN 325 MG PO TABS: 650.0000 mg | ORAL_TABLET | Freq: Four times a day (QID) | ORAL | Status: DC | PRN

## 2021-11-17 MED ORDER — ASCORBIC ACID 500 MG PO TABS
500.0000 mg | ORAL_TABLET | Freq: Every day | ORAL | Status: DC
  Administered 2021-11-17 – 2021-11-20 (×4): 500 mg via ORAL
  Filled 2021-11-17 (×4): qty 1

## 2021-11-17 MED ORDER — NIRMATRELVIR/RITONAVIR (PAXLOVID)TABLET
3.0000 | ORAL_TABLET | Freq: Two times a day (BID) | ORAL | Status: DC
  Administered 2021-11-17 – 2021-11-20 (×6): 3 via ORAL
  Filled 2021-11-17: qty 30

## 2021-11-17 MED ORDER — METHYLPREDNISOLONE SODIUM SUCC 125 MG IJ SOLR
1.0000 mg/kg | Freq: Two times a day (BID) | INTRAMUSCULAR | Status: DC
  Administered 2021-11-18 – 2021-11-20 (×5): 99.375 mg via INTRAVENOUS
  Filled 2021-11-17 (×5): qty 2

## 2021-11-17 MED ORDER — ONDANSETRON HCL 4 MG/2ML IJ SOLN: 4.0000 mg | Freq: Four times a day (QID) | INTRAMUSCULAR | Status: DC | PRN

## 2021-11-17 MED ORDER — ACETAMINOPHEN 325 MG PO TABS
650.0000 mg | ORAL_TABLET | Freq: Once | ORAL | Status: AC
  Administered 2021-11-17: 650 mg via ORAL
  Filled 2021-11-17: qty 2

## 2021-11-17 MED ORDER — MONTELUKAST SODIUM 10 MG PO TABS
10.0000 mg | ORAL_TABLET | Freq: Every day | ORAL | Status: DC
  Administered 2021-11-17 – 2021-11-19 (×3): 10 mg via ORAL
  Filled 2021-11-17 (×3): qty 1

## 2021-11-17 NOTE — ED Notes (Signed)
Patient placed on 2L [place]. O2 sats 96% on 2L.

## 2021-11-17 NOTE — ED Triage Notes (Signed)
Pt comes into the ED via EMS from home with c/o SOB, cough, congestion, chills, tremors all over since last night.  Albuterol x1 neb 166/66 HR84 94%RA 99.2 [id number]

## 2021-11-17 NOTE — ED Provider Notes (Signed)
[facility] Provider Note    Event Date/Time   First MD Initiated Contact with Patient 11/17/21 1547     (approximate)   History   Shortness of Breath and Fever   HPI  Tunisha Ruland is a 76 y.o. female with past medical history of diabetes here with cough, weakness, fever.  Patient states that over the last 24 hours, she has developed progressively worsening cough, weakness, and shortness of breath.  She started to develop moderate headache, chills, and loss of appetite today as well.  Subsequent presents for evaluation.  She states she currently feels short of breath, even at rest, but initially only with exertion.  She has history of asthma but no history of recurrent pneumonia.  She received all of her COVID vaccinations.  She has never had COVID-19 before.  No known specific sick contacts.     Physical Exam   Triage Vital Signs: ED Triage Vitals [11/17/21 1256]  Enc Vitals Group     BP 136/88     Pulse Rate 92     Resp 20     Temp (!) 100.9 F (38.3 C)     Temp Source Oral     SpO2 92 %     Weight      Height      Head Circumference      Peak Flow      Pain Score      Pain Loc      Pain Edu?      Excl. in [place]?     Most recent vital signs: Vitals:   11/17/21 1630 11/17/21 1834  BP: (!) 139/53 125/65  Pulse: 76 76  Resp: 18 18  Temp:  98.5 F (36.9 C)  SpO2: 98% 96%     General: Awake, no distress.  CV:  Good peripheral perfusion.  Regular rate and rhythm. Resp:  Moderate tachypnea, particularly with any, even small, exertion.  Bilateral rails noted, worse in the bases. Abd:  No distention.  No tenderness. Other:  No edema.  Alert and oriented.  Moist mucous membranes.   ED Results / Procedures / Treatments   Labs (all labs ordered are listed, but only abnormal results are displayed) Labs Reviewed  SARS CORONAVIRUS 2 BY RT PCR - Abnormal; Notable for the following components:      Result Value   SARS Coronavirus 2 by RT  PCR POSITIVE (*)    All other components within normal limits  COMPREHENSIVE METABOLIC PANEL - Abnormal; Notable for the following components:   Glucose, Bld 180 (*)    AST 51 (*)    ALT 57 (*)    All other components within normal limits  LACTIC ACID, PLASMA - Abnormal; Notable for the following components:   Lactic Acid, Venous 2.6 (*)    All other components within normal limits  LACTIC ACID, PLASMA - Abnormal; Notable for the following components:   Lactic Acid, Venous 2.2 (*)    All other components within normal limits  CULTURE, BLOOD (ROUTINE X 2)  CULTURE, BLOOD (ROUTINE X 2)  CBC WITH DIFFERENTIAL/PLATELET  PROCALCITONIN  D-DIMER, QUANTITATIVE (NOT AT [facility])  C-REACTIVE PROTEIN  D-DIMER, QUANTITATIVE  CBC WITH DIFFERENTIAL/PLATELET  COMPREHENSIVE METABOLIC PANEL  C-REACTIVE PROTEIN  D-DIMER, QUANTITATIVE     EKG Normal sinus rhythm, ventricular rate 92.  QRS 74, QTc 447.  No acute ST elevations or depressions.  No EKG evidence of acute ischemia or infarct.   RADIOLOGY Chest x-ray: Bibasilar  opacities concerning for pneumonia   I also independently reviewed and agree with radiologist interpretations.   PROCEDURES:  Critical Care performed: Yes, see critical care procedure note(s)  .Critical Care  Performed by: Duffy Bruce, MD Authorized by: Duffy Bruce, MD   Critical care provider statement:    Critical care time (minutes):  30   Critical care time was exclusive of:  Separately billable procedures and treating other patients   Critical care was necessary to treat or prevent imminent or life-threatening deterioration of the following conditions:  Circulatory failure, respiratory failure and cardiac failure   Critical care was time spent personally by me on the following activities:  Development of treatment plan with patient or surrogate, discussions with consultants, evaluation of patient's response to treatment, examination of patient, ordering and  review of laboratory studies, ordering and review of radiographic studies, ordering and performing treatments and interventions, pulse oximetry, re-evaluation of patient's condition and review of old charts     MEDICATIONS ORDERED IN ED: Medications  aspirin EC tablet 81 mg (has no administration in time range)  colesevelam [facility]) tablet 625 mg (has no administration in time range)  furosemide (LASIX) tablet 40 mg (has no administration in time range)  montelukast (SINGULAIR) tablet 10 mg (has no administration in time range)  zolpidem (AMBIEN) tablet 5 mg (has no administration in time range)  enoxaparin (LOVENOX) injection 40 mg (has no administration in time range)  sodium chloride (OCEAN) 0.65 % nasal spray 1-2 spray ( Each Nare Not Given 11/17/21 1946)  nirmatrelvir/ritonavir EUA (PAXLOVID) 3 tablet (has no administration in time range)  Ipratropium-Albuterol (COMBIVENT) respimat 1 puff (has no administration in time range)  methylPREDNISolone sodium succinate (SOLU-MEDROL) 125 mg/2 mL injection 99.375 mg (has no administration in time range)    Followed by  predniSONE (DELTASONE) tablet 50 mg (has no administration in time range)  chlorpheniramine-HYDROcodone 10-8 MG/5ML suspension 5 mL (has no administration in time range)  ascorbic acid (VITAMIN C) tablet 500 mg (has no administration in time range)  zinc sulfate capsule 220 mg (has no administration in time range)  acetaminophen (TYLENOL) tablet 650 mg (has no administration in time range)  docusate sodium (COLACE) capsule 100 mg (has no administration in time range)  ondansetron (ZOFRAN) tablet 4 mg (has no administration in time range)    Or  ondansetron (ZOFRAN) injection 4 mg (has no administration in time range)  dextromethorphan-guaiFENesin (MUCINEX DM) 30-600 MG per 12 hr tablet 1 tablet (has no administration in time range)  benzonatate (TESSALON) capsule 100 mg (has no administration in time range)  ibuprofen (ADVIL)  tablet 800 mg (800 mg Oral Given 11/17/21 1301)  sodium chloride 0.9 % bolus 1,000 mL (1,000 mLs Intravenous New Bag/Given 11/17/21 1618)  sodium chloride 0.9 % bolus 1,000 mL (1,000 mLs Intravenous New Bag/Given 11/17/21 1618)  albuterol (PROVENTIL) (2.5 MG/3ML) 0.083% nebulizer solution 3 mL (3 mLs Inhalation Given 11/17/21 1625)  methylPREDNISolone sodium succinate (SOLU-MEDROL) 125 mg/2 mL injection 125 mg (125 mg Intravenous Given 11/17/21 1623)  acetaminophen (TYLENOL) tablet 650 mg (650 mg Oral Given 11/17/21 1631)     IMPRESSION / MDM / [place] / ED COURSE  I reviewed the triage vital signs and the nursing notes.                               The patient is on the cardiac monitor to evaluate for evidence of arrhythmia and/or significant  heart rate changes.   Ddx:  Differential includes the following, with pertinent life- or limb-threatening emergencies considered:  COVID-19, superimposed bacterial pneumonia, CHF, pleural effusion, PE, ACS  Patient's presentation is most consistent with acute presentation with potential threat to life or bodily function.  MDM:  76 year old female with past medical history of mild asthma here with cough, shortness of breath, and fever.  Patient mildly ill-appearing but speaking in full sentences.  She has bibasilar [place].  She is hypoxic with desat to 86 upon sitting up in the bed.  Patient placed on supplemental O2.  Chest x-ray shows bibasilar pneumonia.  Lab work shows possible minimal transaminitis, lactic acidosis but normal white blood cell count.  Procalcitonin pending.  We will start the patient on steroids, IV fluid resuscitation, Tylenol for headache, and plan to admit.  Repeat LA 2.2, improved but persistent. Admit to medicine.  MEDICATIONS GIVEN IN ED: Medications  aspirin EC tablet 81 mg (has no administration in time range)  colesevelam [facility]) tablet 625 mg (has no administration in time range)  furosemide (LASIX) tablet 40 mg  (has no administration in time range)  montelukast (SINGULAIR) tablet 10 mg (has no administration in time range)  zolpidem (AMBIEN) tablet 5 mg (has no administration in time range)  enoxaparin (LOVENOX) injection 40 mg (has no administration in time range)  sodium chloride (OCEAN) 0.65 % nasal spray 1-2 spray ( Each Nare Not Given 11/17/21 1946)  nirmatrelvir/ritonavir EUA (PAXLOVID) 3 tablet (has no administration in time range)  Ipratropium-Albuterol (COMBIVENT) respimat 1 puff (has no administration in time range)  methylPREDNISolone sodium succinate (SOLU-MEDROL) 125 mg/2 mL injection 99.375 mg (has no administration in time range)    Followed by  predniSONE (DELTASONE) tablet 50 mg (has no administration in time range)  chlorpheniramine-HYDROcodone 10-8 MG/5ML suspension 5 mL (has no administration in time range)  ascorbic acid (VITAMIN C) tablet 500 mg (has no administration in time range)  zinc sulfate capsule 220 mg (has no administration in time range)  acetaminophen (TYLENOL) tablet 650 mg (has no administration in time range)  docusate sodium (COLACE) capsule 100 mg (has no administration in time range)  ondansetron (ZOFRAN) tablet 4 mg (has no administration in time range)    Or  ondansetron (ZOFRAN) injection 4 mg (has no administration in time range)  dextromethorphan-guaiFENesin (MUCINEX DM) 30-600 MG per 12 hr tablet 1 tablet (has no administration in time range)  benzonatate (TESSALON) capsule 100 mg (has no administration in time range)  ibuprofen (ADVIL) tablet 800 mg (800 mg Oral Given 11/17/21 1301)  sodium chloride 0.9 % bolus 1,000 mL (1,000 mLs Intravenous New Bag/Given 11/17/21 1618)  sodium chloride 0.9 % bolus 1,000 mL (1,000 mLs Intravenous New Bag/Given 11/17/21 1618)  albuterol (PROVENTIL) (2.5 MG/3ML) 0.083% nebulizer solution 3 mL (3 mLs Inhalation Given 11/17/21 1625)  methylPREDNISolone sodium succinate (SOLU-MEDROL) 125 mg/2 mL injection 125 mg (125 mg Intravenous  Given 11/17/21 1623)  acetaminophen (TYLENOL) tablet 650 mg (650 mg Oral Given 11/17/21 1631)     Consults:  Hospitalist consulted for admission   EMR reviewed  Reviewed IM notes from Dr. Edwina Barth, [place] visits for Dr. Saralyn Pilar     FINAL CLINICAL IMPRESSION(S) / ED DIAGNOSES   Final diagnoses:  COVID-19  Acute respiratory failure with hypoxemia ([place])     Rx / DC Orders   ED Discharge Orders     None        Note:  This document was prepared using Dragon voice recognition software  and may include unintentional dictation errors.   Duffy Bruce, MD 11/17/21 [phone]

## 2021-11-17 NOTE — H&P (Signed)
History and Physical    Patient: Meredith Caldwell UQJ:[id number] DOB: [birth year] DOA: 11/17/2021 DOS: the patient was seen and examined on 11/17/2021 PCP: Baxter Hire, MD  Patient coming from: Home  Chief Complaint:  Chief Complaint  Patient presents with   Shortness of Breath   Fever   HPI: Meredith Caldwell is a 76 y.o. female with medical history significant of type II DM, HLD, HTN, obesity, mood disorder, asthma. Patient presented to the hospital with complaints of 1 day onset of runny nose, fatigue and tiredness and cough. Patient denies any significant exposure although she was at a celebration recently, but no one was sick there. Denies any active smoking. She started having complaints of runny nose last night on 7/6 with cough.  When she woke up on the morning of 7/7 started having fever with chills and tremors.  She also started having generalized body ache.  She called her cardiology office as she started having some chest tightness and was referred to ER for further work-up and evaluation. At the time of my evaluation she does have some headache, runny nose, cough with clear expectoration, fatigue and body ache. She denies any nausea vomiting or diarrhea.  No abdominal pain.  No swelling in the legs. She remains compliant with all her medication. She has taken 3 COVID vaccines. Denies any recent travel.  Review of Systems: As mentioned in the history of present illness. All other systems reviewed and are negative. Past Medical History:  Diagnosis Date   Bone spur 2000   right shoulder   Diabetes ([place]) 2005   Edema 2000   right arm   Melanoma ([place]) 2008   left arm   Neuroma 2006   Right ear   Vaso vagal episode 1992   Past Surgical History:  Procedure Laterality Date   [place]   right chest, left brest   CHOLECYSTECTOMY  1972   EXCISION/RELEASE BURSA HIP Left 1997   KNEE SURGERY Right 2013    LIVER BIOPSY  2005   MELANOMA EXCISION Left 2008   arm; cancerous   TONSILLECTOMY AND ADENOIDECTOMY  1950   Social History:  reports that she quit smoking about 52 years ago. Her smoking use included cigarettes. She has never used smokeless tobacco. She reports current alcohol use. She reports that she does not use drugs.  Allergies  Allergen Reactions   Statins    Theophyllines Other (See Comments)    "It makes me feel high"   Zetia [Ezetimibe]     No family history on file.  Prior to Admission medications   Medication Sig Start Date End Date Taking? Authorizing Provider  albuterol (PROVENTIL HFA;VENTOLIN HFA) 108 (90 Base) MCG/ACT inhaler Inhale into the lungs.    [provider]  aspirin EC 81 MG tablet Take by mouth.    [provider]  cetirizine (ZYRTEC) 10 MG tablet Take 10 mg by mouth daily.    [provider]  colesevelam [facility]) 625 MG tablet Take by mouth. 03/27/18   [provider]  cyanocobalamin 1000 MCG tablet Take 1 tablet by mouth daily.    [provider]  diclofenac (VOLTAREN) 75 MG EC tablet  07/30/18   [provider]  FISH OIL-KRILL OIL PO Take 360 mg by mouth.    [provider]  furosemide (LASIX) 40 MG tablet Take by mouth. 10/30/17   [provider]  losartan (COZAAR) 50  MG tablet take 1 tablet by mouth once daily 06/26/17   [provider]  metFORMIN (GLUCOPHAGE) 1000 MG tablet Take 1,000 mg by mouth 2 (two) times daily with a meal.    [provider]  montelukast (SINGULAIR) 10 MG tablet Take 10 mg by mouth at bedtime.    [provider]  Multiple Vitamin (MULTIVITAMIN WITH MINERALS) TABS tablet Take 1 tablet by mouth daily.    [provider]  nabumetone (RELAFEN) 500 MG tablet Take 500 mg by mouth daily.    [provider]  naproxen sodium (ALEVE) 220 MG tablet Take 220 mg by mouth daily as needed.    [provider]  PAROXETINE  HCL ER PO Take 5 mg by mouth daily.     [provider]  penciclovir (DENAVIR) 1 % cream Apply 1 application topically every 2 (two) hours as needed.    [provider]  zolpidem (AMBIEN) 5 MG tablet Take 5 mg by mouth at bedtime as needed for sleep.    [provider]    Physical Exam: Vitals:   11/17/21 1256 11/17/21 1412 11/17/21 1605 11/17/21 1630  BP: 136/88  (!) 140/54 (!) 139/53  Pulse: 92  85 76  Resp: 20  18 18   Temp: (!) 100.9 F (38.3 C) 98 F (36.7 C)    TempSrc: Oral Oral    SpO2: 92%  (!) 89% 98%   General: Appear in mild distress, no Rash; Oral Mucosa Clear, moist. no Abnormal Neck Mass Or lumps, Conjunctiva normal  Cardiovascular: S1 and S2 Present, no Murmur, Respiratory: increased respiratory effort, Bilateral Air entry present and bilateral  Crackles, no wheezes Abdomen: Bowel Sound present, Soft and no tenderness Extremities: trace Pedal edema Neurology: alert and oriented to time, place, and person affect appropriate. no new focal deficit Gait not checked due to patient safety concerns   Data Reviewed: I have Reviewed nursing notes, Vitals, and Lab results since pt's last encounter. Pertinent lab results CBC, CMP, COVID test I have ordered test including CRP, ESR, D-dimer, CBC, CMP I have independently visualized and interpreted imaging chest x-ray which showed bilateral multifocal pneumonia. I have discussed pt's care plan and test results with ED provider.   Assessment and Plan: Acute respiratory failure with hypoxia (HCC) Acute COVID-19 Viral Pneumonia-CT value of 12.9. Asthma, chronic, unspecified asthma severity, with acute exacerbation Presents with 1 day history of fever cough malaise. Found to have COVID-19 infection. POA, 86% on room air on admission.  CXR: hazy bilateral peripheral opacities Oxygen requirement: On 2 L of oxygen currently saturating 96% CRP: Pending Paxlovid: Started on 7/7 Steroids: 1 mg/kg IV  Solu-Medrol started on 7/7. Baricitinib/Actemra: Patient has provided consent to use the medicine.  If does not improve the next 24 to 48 hours consider using this medication to improve outcomes. Antibiotics: Currently not indicated.  Procalcitonin pending. Isolation duration: First positive test date 7/7, will remain in isolation until 7/18. Will use Combivent on a scheduled basis for asthma exacerbation. Use Tessalon Perles, Mucinex DM, Tussionex, flutter valve.  Lactic acidosis From work of breathing. Patient does not appear clinically dehydrated. Patient was given some IV fluid in the ER.  I will currently hold.  Diabetes mellitus type 2, uncomplicated (HCC) Check hemoglobin A1c. Anticipating steroid-induced hyperglycemia. Continue moderate sliding scale insulin. Hold metformin.  Obesity Placing the patient at high risk of poor outcome.  Mood disorder ([place]) On Paxil. We will resume  HLD (hyperlipidemia) Continuing statin.  Essential hypertension Blood  pressure actually stable. Holding losartan.   Chronic HFpEF. Patient is on Lasix and potassium at home. Currently due to poor p.o. intake in the setting of COVID-19 infection as well as lactic acid elevation, I will be holding Lasix and potassium both.  Insomnia Continue Ambien.  Advance Care Planning:   Code Status: Full Code patient would like her son to be her HCPOA in an event if she has cardiac arrest. Patient has provided consent for Actemra use in an event she needs that medication.  Consults: none  Family Communication: none  Author: Berle Mull, MD 11/17/2021 6:25 PM  For on call review [email or web address].

## 2021-11-18 DIAGNOSIS — Z87891 Personal history of nicotine dependence: Secondary | ICD-10-CM | POA: Diagnosis not present

## 2021-11-18 DIAGNOSIS — Z90710 Acquired absence of both cervix and uterus: Secondary | ICD-10-CM | POA: Diagnosis not present

## 2021-11-18 DIAGNOSIS — I5032 Chronic diastolic (congestive) heart failure: Secondary | ICD-10-CM | POA: Diagnosis present

## 2021-11-18 DIAGNOSIS — Z6838 Body mass index (BMI) 38.0-38.9, adult: Secondary | ICD-10-CM | POA: Diagnosis not present

## 2021-11-18 DIAGNOSIS — Z888 Allergy status to other drugs, medicaments and biological substances status: Secondary | ICD-10-CM | POA: Diagnosis not present

## 2021-11-18 DIAGNOSIS — U071 COVID-19: Principal | ICD-10-CM | POA: Diagnosis present

## 2021-11-18 DIAGNOSIS — E119 Type 2 diabetes mellitus without complications: Secondary | ICD-10-CM | POA: Diagnosis not present

## 2021-11-18 DIAGNOSIS — E669 Obesity, unspecified: Secondary | ICD-10-CM | POA: Diagnosis present

## 2021-11-18 DIAGNOSIS — E1165 Type 2 diabetes mellitus with hyperglycemia: Secondary | ICD-10-CM | POA: Diagnosis present

## 2021-11-18 DIAGNOSIS — J45901 Unspecified asthma with (acute) exacerbation: Secondary | ICD-10-CM | POA: Diagnosis present

## 2021-11-18 DIAGNOSIS — Z85820 Personal history of malignant melanoma of skin: Secondary | ICD-10-CM | POA: Diagnosis not present

## 2021-11-18 DIAGNOSIS — J1282 Pneumonia due to coronavirus disease 2019: Secondary | ICD-10-CM | POA: Diagnosis present

## 2021-11-18 DIAGNOSIS — I110 Hypertensive heart disease with heart failure: Secondary | ICD-10-CM | POA: Diagnosis present

## 2021-11-18 DIAGNOSIS — J9601 Acute respiratory failure with hypoxia: Secondary | ICD-10-CM | POA: Diagnosis present

## 2021-11-18 DIAGNOSIS — Z79899 Other long term (current) drug therapy: Secondary | ICD-10-CM | POA: Diagnosis not present

## 2021-11-18 DIAGNOSIS — R0602 Shortness of breath: Secondary | ICD-10-CM | POA: Diagnosis present

## 2021-11-18 DIAGNOSIS — R5381 Other malaise: Secondary | ICD-10-CM | POA: Diagnosis present

## 2021-11-18 DIAGNOSIS — G4700 Insomnia, unspecified: Secondary | ICD-10-CM | POA: Diagnosis present

## 2021-11-18 DIAGNOSIS — Z9049 Acquired absence of other specified parts of digestive tract: Secondary | ICD-10-CM | POA: Diagnosis not present

## 2021-11-18 DIAGNOSIS — F39 Unspecified mood [affective] disorder: Secondary | ICD-10-CM | POA: Diagnosis present

## 2021-11-18 DIAGNOSIS — E872 Acidosis, unspecified: Secondary | ICD-10-CM | POA: Diagnosis present

## 2021-11-18 DIAGNOSIS — E785 Hyperlipidemia, unspecified: Secondary | ICD-10-CM | POA: Diagnosis present

## 2021-11-18 LAB — CBC WITH DIFFERENTIAL/PLATELET
Abs Immature Granulocytes: 0.04 10*3/uL (ref 0.00–0.07)
Basophils Absolute: 0 10*3/uL (ref 0.0–0.1)
Basophils Relative: 0 %
Eosinophils Absolute: 0 10*3/uL (ref 0.0–0.5)
Eosinophils Relative: 0 %
HCT: 40.9 % (ref 36.0–46.0)
Hemoglobin: 13 g/dL (ref 12.0–15.0)
Immature Granulocytes: 1 %
Lymphocytes Relative: 20 %
Lymphs Abs: 1.3 10*3/uL (ref 0.7–4.0)
MCH: 29.4 pg (ref 26.0–34.0)
MCHC: 31.8 g/dL (ref 30.0–36.0)
MCV: 92.5 fL (ref 80.0–100.0)
Monocytes Absolute: 0.3 10*3/uL (ref 0.1–1.0)
Monocytes Relative: 5 %
Neutro Abs: 4.7 10*3/uL (ref 1.7–7.7)
Neutrophils Relative %: 74 %
Platelets: 249 10*3/uL (ref 150–400)
RBC: 4.42 MIL/uL (ref 3.87–5.11)
RDW: 14.2 % (ref 11.5–15.5)
WBC: 6.3 10*3/uL (ref 4.0–10.5)
nRBC: 0 % (ref 0.0–0.2)

## 2021-11-18 LAB — COMPREHENSIVE METABOLIC PANEL
ALT: 57 U/L — ABNORMAL HIGH (ref 0–44)
AST: 49 U/L — ABNORMAL HIGH (ref 15–41)
Albumin: 4 g/dL (ref 3.5–5.0)
Alkaline Phosphatase: 48 U/L (ref 38–126)
Anion gap: 8 (ref 5–15)
BUN: 13 mg/dL (ref 8–23)
CO2: 26 mmol/L (ref 22–32)
Calcium: 9.3 mg/dL (ref 8.9–10.3)
Chloride: 106 mmol/L (ref 98–111)
Creatinine, Ser: 0.63 mg/dL (ref 0.44–1.00)
GFR, Estimated: >60 mL/min (ref >60)
Glucose, Bld: 202 mg/dL — ABNORMAL HIGH (ref 70–99)
Potassium: 4 mmol/L (ref 3.5–5.1)
Sodium: 140 mmol/L (ref 135–145)
Total Bilirubin: 0.6 mg/dL (ref 0.3–1.2)
Total Protein: 7.4 g/dL (ref 6.5–8.1)

## 2021-11-18 LAB — D-DIMER, QUANTITATIVE: D-Dimer, Quant: 0.35 ug/mL-FEU (ref 0.00–0.50)

## 2021-11-18 LAB — C-REACTIVE PROTEIN
CRP: 5.1 mg/dL — ABNORMAL HIGH (ref <1.0)
CRP: 6.1 mg/dL — ABNORMAL HIGH (ref <1.0)

## 2021-11-18 MED ORDER — CHLORHEXIDINE GLUCONATE CLOTH 2 % EX PADS
6.0000 | MEDICATED_PAD | Freq: Every day | CUTANEOUS | Status: DC
  Administered 2021-11-19: 6 via TOPICAL

## 2021-11-18 NOTE — Progress Notes (Signed)
Progress Note Patient: Meredith Caldwell DOB: [birth year] DOA: 11/17/2021  DOS: the patient was seen and examined on 11/18/2021  Brief hospital course: Meredith Caldwell is a 76 y.o. female with medical history significant of type II DM, HLD, HTN, obesity, mood disorder, asthma. Patient presented to the hospital with complaints of 1 day onset of runny nose, fatigue and tiredness and cough. Found to have COVID-19 infection with pneumonia currently being treated. Assessment and Plan: Acute respiratory failure with hypoxia (HCC) Acute COVID-19 Viral Pneumonia-CT value of 12.9. Asthma, chronic, unspecified asthma severity, with acute exacerbation Presents with 1 day history of fever cough malaise. Found to have COVID-19 infection. POA, 86% on room air on admission.  CXR: hazy bilateral peripheral opacities Oxygen requirement: Currently oxygenation improving to room air. CRP: 5.1 on admission.  Currently trending up to 6.1. Paxlovid: Started on 7/7 Steroids: 1 mg/kg IV Solu-Medrol started on 7/7. Baricitinib/Actemra: Patient has provided consent to use the medicine.  If does not improve the next 24 to 48 hours consider using this medication to improve outcomes. Antibiotics: Currently not indicated.  Procalcitonin undetectable.. Isolation duration: First positive test date 7/7, will remain in isolation until 7/18. Will use Combivent on a scheduled basis for asthma exacerbation. Use Tessalon Perles, Mucinex DM, Tussionex, flutter valve. CRP upward trend is concerning.  For now we will monitor.  Although patient is now off of oxygen at rest.   Lactic acidosis From work of breathing. Patient does not appear clinically dehydrated. Patient was given some IV fluid in the ER.  I will currently hold.   Diabetes mellitus type 2, uncomplicated (HCC) Check hemoglobin A1c. Anticipating steroid-induced hyperglycemia. Continue moderate sliding scale insulin. Hold metformin.   Obesity Body  mass index is 38.79 kg/m.  Placing the patient at high risk of poor outcome.   Mood disorder ([place]) On Paxil. We will resume   HLD (hyperlipidemia) Continuing statin.   Essential hypertension Blood pressure actually stable. Holding losartan.    Chronic HFpEF. Patient is on Lasix and potassium at home. Currently due to poor p.o. intake in the setting of COVID-19 infection as well as lactic acid elevation, I will be holding Lasix and potassium both.   Insomnia Continue Ambien.  Subjective: Feeling better but continues to have fatigue and tiredness.  Continues to have body ache.  Continues to have cough.  Continues to have nausea vomiting.  No fever no chills.  No chest pain.  Physical Exam: Vitals:   11/17/21 2015 11/18/21 0555 11/18/21 0854 11/18/21 1511  BP: (!) 126/57 (!) 143/70 (!) 175/84 (!) 151/64  Pulse: 70 69 78 79  Resp: '20 16 18 18  '$ Temp: 98.6 F (37 C) 98.4 F (36.9 C) 98 F (36.7 C) 98.7 F (37.1 C)  TempSrc: Oral Oral Oral Oral  SpO2: 98% 96% 95% 97%  Weight:      Height:       General: Appear in moderate distress; no visible Abnormal Neck Mass Or lumps, Conjunctiva normal Cardiovascular: S1 and S2 Present, no Murmur, Respiratory: increased respiratory effort, Bilateral Air entry present and  bilateral  Crackles, Occasional  wheezes Abdomen: Bowel Sound present, Non tender  Extremities: no Pedal edema Neurology: alert and oriented to time, place, and person  Gait not checked due to patient safety concerns   Data Reviewed: I have Reviewed nursing notes, Vitals, and Lab results since pt's last encounter. Pertinent lab results CBC, CMP, CRP, D-dimer I have ordered test including CBC, CMP, CRP, D-dimer    Family  Communication: None at bedside  Disposition: Status is: Inpatient Remains inpatient appropriate because: Needing IV therapy for COVID-19 infection Author: Berle Mull, MD 11/18/2021 7:10 PM  Please look on [email or web address] to find out who is on  call.

## 2021-11-18 NOTE — Hospital Course (Signed)
Meredith Caldwell is a 76 y.o. female with medical history significant of type II DM, HLD, HTN, obesity, mood disorder, asthma. Patient presented to the hospital with complaints of 1 day onset of runny nose, fatigue and tiredness and cough. Found to have COVID-19 infection with pneumonia currently being treated.

## 2021-11-19 DIAGNOSIS — J9601 Acute respiratory failure with hypoxia: Secondary | ICD-10-CM | POA: Diagnosis not present

## 2021-11-19 LAB — COMPREHENSIVE METABOLIC PANEL
ALT: 53 U/L — ABNORMAL HIGH (ref 0–44)
AST: 42 U/L — ABNORMAL HIGH (ref 15–41)
Albumin: 3.8 g/dL (ref 3.5–5.0)
Alkaline Phosphatase: 48 U/L (ref 38–126)
Anion gap: 8 (ref 5–15)
BUN: 16 mg/dL (ref 8–23)
CO2: 30 mmol/L (ref 22–32)
Calcium: 8.9 mg/dL (ref 8.9–10.3)
Chloride: 102 mmol/L (ref 98–111)
Creatinine, Ser: 0.63 mg/dL (ref 0.44–1.00)
GFR, Estimated: >60 mL/min (ref >60)
Glucose, Bld: 228 mg/dL — ABNORMAL HIGH (ref 70–99)
Potassium: 4 mmol/L (ref 3.5–5.1)
Sodium: 140 mmol/L (ref 135–145)
Total Bilirubin: 0.6 mg/dL (ref 0.3–1.2)
Total Protein: 6.8 g/dL (ref 6.5–8.1)

## 2021-11-19 LAB — CBC WITH DIFFERENTIAL/PLATELET
Abs Immature Granulocytes: 0.1 10*3/uL — ABNORMAL HIGH (ref 0.00–0.07)
Basophils Absolute: 0 10*3/uL (ref 0.0–0.1)
Basophils Relative: 0 %
Eosinophils Absolute: 0 10*3/uL (ref 0.0–0.5)
Eosinophils Relative: 0 %
HCT: 39.3 % (ref 36.0–46.0)
Hemoglobin: 12.5 g/dL (ref 12.0–15.0)
Immature Granulocytes: 1 %
Lymphocytes Relative: 7 %
Lymphs Abs: 1.1 10*3/uL (ref 0.7–4.0)
MCH: 29.5 pg (ref 26.0–34.0)
MCHC: 31.8 g/dL (ref 30.0–36.0)
MCV: 92.7 fL (ref 80.0–100.0)
Monocytes Absolute: 0.6 10*3/uL (ref 0.1–1.0)
Monocytes Relative: 4 %
Neutro Abs: 13.3 10*3/uL — ABNORMAL HIGH (ref 1.7–7.7)
Neutrophils Relative %: 88 %
Platelets: 252 10*3/uL (ref 150–400)
RBC: 4.24 MIL/uL (ref 3.87–5.11)
RDW: 14.1 % (ref 11.5–15.5)
WBC: 15 10*3/uL — ABNORMAL HIGH (ref 4.0–10.5)
nRBC: 0 % (ref 0.0–0.2)

## 2021-11-19 LAB — D-DIMER, QUANTITATIVE: D-Dimer, Quant: <0.27 ug/mL-FEU (ref 0.00–0.50)

## 2021-11-19 LAB — C-REACTIVE PROTEIN: CRP: 2.3 mg/dL — ABNORMAL HIGH (ref <1.0)

## 2021-11-19 MED ORDER — LOSARTAN POTASSIUM 50 MG PO TABS
50.0000 mg | ORAL_TABLET | Freq: Every day | ORAL | Status: DC
  Administered 2021-11-19 – 2021-11-20 (×2): 50 mg via ORAL
  Filled 2021-11-19 (×2): qty 1

## 2021-11-19 NOTE — Progress Notes (Signed)
SATURATION QUALIFICATIONS: (This note is used to comply with regulatory documentation for home oxygen)    Patient Saturations on Room Air at Rest = 94%  Patient Saturations on Room Air while Ambulating = 92%  Patient Saturations on 0 Liters of oxygen while Ambulating = 92%  Please briefly explain why patient needs home oxygen: 

## 2021-11-19 NOTE — Progress Notes (Signed)
Progress Note Patient: Shaila Gilchrest BZJ:[id number] DOB: 07-18-1945 DOA: 11/17/2021  DOS: the patient was seen and examined on 11/19/2021  Brief hospital course: Margret Moat is a 76 y.o. female with medical history significant of type II DM, HLD, HTN, obesity, mood disorder, asthma. Patient presented to the hospital with complaints of 1 day onset of runny nose, fatigue and tiredness and cough. Found to have COVID-19 infection with pneumonia currently being treated. Assessment and Plan: Acute respiratory failure with hypoxia (HCC) Acute COVID-19 Viral Pneumonia-CT value of 12.9. Asthma, chronic, unspecified asthma severity, with acute exacerbation Presents with 1 day history of fever cough malaise. Found to have COVID-19 infection. POA, 86% on room air on admission.  CXR: hazy bilateral peripheral opacities Oxygen requirement: Currently oxygenation improving to room air.  Both at rest as well as on exertion. CRP: 5.1 on admission.  After trending up to 6.1 now running down to 2 Paxlovid: Started on 7/7 Steroids: 1 mg/kg IV Solu-Medrol started on 7/7.  We will switch to p.o. prednisone 7/10. Baricitinib/Actemra: Patient has provided consent to use the medicine.  Although most likely will not needed. Antibiotics: Currently not indicated.  Procalcitonin undetectable.. Isolation duration: First positive test date 7/7, will remain in isolation until 7/18. Will use Combivent on a scheduled basis for asthma exacerbation. Use Tessalon Perles, Mucinex DM, Tussionex, flutter valve. CRP upward trend is concerning.  For now we will monitor.  Although patient is now off of oxygen at rest.   Lactic acidosis From work of breathing. Patient does not appear clinically dehydrated. Patient was given some IV fluid in the ER.  No further fluid.  Deconditioning. In the setting of COVID-19 infection. Patient was seen by physical therapy on 7/9 and recommended SNF. Anticipate rapid improvement in  patient's condition and therefore recommending physiatry will evaluate the patient on 7/10.   Diabetes mellitus type 2, without long-term insulin use, with hyperglycemia uncomplicated (HCC) Hemoglobin A1c 7.4 in May 2023. Anticipating steroid-induced hyperglycemia. Continue moderate sliding scale insulin. Hold metformin.   Obesity Body mass index is 38.79 kg/m.  Placing the patient at high risk of poor outcome.   Mood disorder ([place]) On Paxil. We will resume   HLD (hyperlipidemia) Continuing statin.   Essential hypertension Blood pressure actually stable. Holding losartan.    Chronic HFpEF. Patient is on Lasix and potassium at home. Currently due to poor p.o. intake in the setting of COVID-19 infection as well as lactic acid elevation, I will be holding Lasix and potassium both.   Insomnia Continue Ambien.  Subjective: Reports ongoing shortness of breath as well as fatigue.  No chest pain.  Continues to have dry cough.  Oral intake improving.  No diarrhea constipation.  Physical Exam: Vitals:   11/19/21 0432 11/19/21 0814 11/19/21 1133 11/19/21 1551  BP: (!) 158/81 (!) 161/85  (!) 156/76  Pulse: 72 67 84 69  Resp: '19 18  18  '$ Temp: 98.2 F (36.8 C) 98.1 F (36.7 C)  98.6 F (37 C)  TempSrc: Oral Oral  Oral  SpO2: 91% 97% 92% 96%  Weight:      Height:       General: Appear in mild distress, no Rash; Oral Mucosa Clear, moist. no Abnormal Neck Mass Or lumps, Conjunctiva normal  Cardiovascular: S1 and S2 Present, no Murmur, Respiratory: increased respiratory effort, Bilateral Air entry present and bilateral  Crackles, no wheezes Abdomen: Bowel Sound present, Soft and no tenderness Extremities: trace Pedal edema Neurology: alert and oriented to time, place, and  person affect appropriate. no new focal deficit  Data Reviewed: I have Reviewed nursing notes, Vitals, and Lab results since pt's last encounter. Pertinent lab results CBC and CMP and and CRP I have ordered  test including CBC and CMP and CRP    Family Communication: None at bedside  Disposition: Status is: Inpatient Remains inpatient appropriate because: Awaiting placement.  Author: Berle Mull, MD 11/19/2021 7:45 PM  Please look on [email or web address] to find out who is on call.

## 2021-11-19 NOTE — Evaluation (Signed)
Physical Therapy Evaluation Patient Details Name: Meredith Caldwell MRN: [id number] DOB: 10/15/1945 Today's Date: 11/19/2021  History of Present Illness  Pt admitted to [facility] on 11/17/21 for c/o wornseing cough, weakness, generalized body aches, and SOB; dx with acute respiratory failure with hypoxia. COVID positive. Imaging revealed bibasilar PNA. Significant PMH includes: T2DM, HLD, HTN, obesity, mood disorder, asthma.   Clinical Impression  Pt is a 76 year old F admitted to hospital on 11/17/21 for acute respiratory failure with hypoxia. At baseline, pt is mod I for limited community ambulation with SPC (no AD for [facility] ambulation), ADL's, cooking, transportation, and medication management; pt has assist for cleaning. Pt presents with generalized weakness, decreased activity tolerance, and decreased balance resulting in impaired functional mobility from baseline. Due to deficits, pt required mod I for bed mobility, supervision for transfers with RW, and supervision to ambulate multiple short bouts ranging from 4-73f with RW. Further mobility limited secondary to labored breathing and DOE, with VSS and SpO2 at 94% on RA. Deficits limit the pt's ability to safely and independently perform ADL's, transfer, and ambulate. Pt will benefit from acute skilled PT services to address deficits for return to baseline function.    Deficits expected to improve with continued medical management and therapy. However, pt currently reporting that she has PRN assist at home, but that it's "not very often." Due to decreased activity tolerance with significant DOE after minimal mobility, it is believed that pt will require increased assist for self-care tasks including ADL's and IADL's. Therefore, PT recommends short-term rehab to address deficits for return to baseline function and decreased caregiver burden. Pt will also benefit from RW and tub-transfer bench, for energy conservation and safety with functional mobility/ADL's upon  return home. Pt agreeable for PT recommendations.        Recommendations for follow up therapy are one component of a multi-disciplinary discharge planning process, led by the attending physician.  Recommendations may be updated based on patient status, additional functional criteria and insurance authorization.  Follow Up Recommendations Skilled nursing-short term rehab (<3 hours/day) Can patient physically be transported by private vehicle: Yes    Assistance Recommended at Discharge Intermittent Supervision/Assistance  Patient can return home with the following  A little help with walking and/or transfers;A little help with bathing/dressing/bathroom;Help with stairs or ramp for entrance;Assist for transportation;Assistance with cooking/housework    Equipment Recommendations Rolling walker (2 wheels) (tub bench)     Functional Status Assessment Patient has had a recent decline in their functional status and demonstrates the ability to make significant improvements in function in a reasonable and predictable amount of time.     Precautions / Restrictions Precautions Precautions: Fall Precaution Comments: Air/Contact (COVID) Restrictions Weight Bearing Restrictions: No      Mobility  Bed Mobility Overal bed mobility: Modified Independent             General bed mobility comments: to sit EOB, HOB semi-elevated    Transfers Overall transfer level: Needs assistance Equipment used: Rolling walker (2 wheels) Transfers: Sit to/from Stand Sit to Stand: Supervision           General transfer comment: for safety, cues for hand placement with RW    Ambulation/Gait Ambulation/Gait assistance: Supervision Gait Distance (Feet): 38 Feet (478fx2 (EOB>recliner>BSC); 1584f2) Assistive device: Rolling walker (2 wheels)         General Gait Details: Supervision for safety. Further mobility limited secondary to DOE; VSS with SpO2 at 94% on RA. Demonstrates slowed cadence with  early reciprocal gait and decreased foot clearance bil.    Balance Overall balance assessment: Needs assistance Sitting-balance support: Feet supported, No upper extremity supported Sitting balance-Leahy Scale: Good     Standing balance support: Bilateral upper extremity supported, During functional activity Standing balance-Leahy Scale: Fair Standing balance comment: BUE support in RW                             Pertinent Vitals/Pain Pain Assessment Pain Assessment: No/denies pain    Home Living Family/patient expects to be discharged to:: Private residence Living Arrangements: Alone Available Help at Discharge: Friend(s);Available PRN/intermittently Type of Home: House Home Access: Ramped entrance       Home Layout: One level Home Equipment: Standard Walker;Cane - single point;BSC/3in1;Electric scooter;Wheelchair - power Additional Comments: pt reports all equipment is from the 90's; reports that power WC needs a new battery    Prior Function Prior Level of Function : Independent/Modified Independent             Mobility Comments: Mod I for limited community ambulation with SPC in R hand; pt uses walls/furniture in home for steadying. Uses power WC for longer community distances. Reports using SW for tub transfers (uses as grab bars), and sits on the edge of the tub to bathe. ADLs Comments: Mod I for ADL's, cooking, driving, and medication management. Has assistance for celaning.        Extremity/Trunk Assessment   Upper Extremity Assessment Upper Extremity Assessment: Generalized weakness (grossly 3+/5; sensation and coordination intact)    Lower Extremity Assessment Lower Extremity Assessment: Generalized weakness (grossly 3+/5; sensation and coordination intact)    Cervical / Trunk Assessment Cervical / Trunk Assessment: Normal  Communication   Communication: No difficulties  Cognition Arousal/Alertness: Awake/alert Behavior During Therapy:  WFL for tasks assessed/performed Overall Cognitive Status: Within Functional Limits for tasks assessed                                 General Comments: A&O x4; able to follow 100% of multi-step commands        General Comments General comments (skin integrity, edema, etc.): DOE with functional mobility, however, presents at 94% on RA    Exercises Other Exercises Other Exercises: Participated in bed mobility, transfers, gait, and toileting with supervision. Further mobility limited secondary to decreased activity tolerance/DOE. Other Exercises: Pt educated re: PT role/POC, DC recommendations, pacing, safety with mobility. She verbalized understanding.   Assessment/Plan    PT Assessment Patient needs continued PT services  PT Problem List Decreased strength;Decreased activity tolerance;Decreased balance;Decreased mobility;Cardiopulmonary status limiting activity       PT Treatment Interventions Gait training;Stair training;Functional mobility training;Therapeutic activities;Therapeutic exercise;Balance training;Neuromuscular re-education    PT Goals (Current goals can be found in the Care Plan section)  Acute Rehab PT Goals Patient Stated Goal: "go to rehab" PT Goal Formulation: With patient Time For Goal Achievement: 12/03/21 Potential to Achieve Goals: Good    Frequency Min 2X/week        AM-PAC PT "6 Clicks" Mobility  Outcome Measure Help needed turning from your back to your side while in a flat bed without using bedrails?: None Help needed moving from lying on your back to sitting on the side of a flat bed without using bedrails?: None Help needed moving to and from a bed to a chair (including a wheelchair)?: A Little Help needed standing  up from a chair using your arms (e.g., wheelchair or bedside chair)?: A Little Help needed to walk in hospital room?: A Little Help needed climbing 3-5 steps with a railing? : A Little 6 Click Score: 20    End of  Session Equipment Utilized During Treatment: Gait belt Activity Tolerance: Patient tolerated treatment well;Patient limited by fatigue Patient left: in chair;with call bell/phone within reach;with chair alarm set Nurse Communication: Mobility status PT Visit Diagnosis: Unsteadiness on feet (R26.81);Muscle weakness (generalized) (M62.81)    Time: 1504-1364 PT Time Calculation (min) (ACUTE ONLY): 38 min   Charges:   PT Evaluation $PT Eval Low Complexity: 1 Low PT Treatments $Therapeutic Activity: 8-22 mins       Herminio Commons, PT, DPT 1:35 PM,11/19/21

## 2021-11-20 DIAGNOSIS — U071 COVID-19: Secondary | ICD-10-CM | POA: Diagnosis not present

## 2021-11-20 DIAGNOSIS — J45901 Unspecified asthma with (acute) exacerbation: Secondary | ICD-10-CM | POA: Diagnosis not present

## 2021-11-20 DIAGNOSIS — E872 Acidosis, unspecified: Secondary | ICD-10-CM

## 2021-11-20 DIAGNOSIS — J9601 Acute respiratory failure with hypoxia: Secondary | ICD-10-CM | POA: Diagnosis not present

## 2021-11-20 DIAGNOSIS — I10 Essential (primary) hypertension: Secondary | ICD-10-CM

## 2021-11-20 DIAGNOSIS — E119 Type 2 diabetes mellitus without complications: Secondary | ICD-10-CM | POA: Diagnosis not present

## 2021-11-20 DIAGNOSIS — F39 Unspecified mood [affective] disorder: Secondary | ICD-10-CM

## 2021-11-20 DIAGNOSIS — E669 Obesity, unspecified: Secondary | ICD-10-CM

## 2021-11-20 DIAGNOSIS — E785 Hyperlipidemia, unspecified: Secondary | ICD-10-CM

## 2021-11-20 LAB — CBC WITH DIFFERENTIAL/PLATELET
Abs Immature Granulocytes: 0.1 10*3/uL — ABNORMAL HIGH (ref 0.00–0.07)
Basophils Absolute: 0 10*3/uL (ref 0.0–0.1)
Basophils Relative: 0 %
Eosinophils Absolute: 0 10*3/uL (ref 0.0–0.5)
Eosinophils Relative: 0 %
HCT: 39.4 % (ref 36.0–46.0)
Hemoglobin: 12.8 g/dL (ref 12.0–15.0)
Immature Granulocytes: 1 %
Lymphocytes Relative: 6 %
Lymphs Abs: 1 10*3/uL (ref 0.7–4.0)
MCH: 29.7 pg (ref 26.0–34.0)
MCHC: 32.5 g/dL (ref 30.0–36.0)
MCV: 91.4 fL (ref 80.0–100.0)
Monocytes Absolute: 0.3 10*3/uL (ref 0.1–1.0)
Monocytes Relative: 2 %
Neutro Abs: 14 10*3/uL — ABNORMAL HIGH (ref 1.7–7.7)
Neutrophils Relative %: 91 %
Platelets: 297 10*3/uL (ref 150–400)
RBC: 4.31 MIL/uL (ref 3.87–5.11)
RDW: 13.9 % (ref 11.5–15.5)
WBC: 15.4 10*3/uL — ABNORMAL HIGH (ref 4.0–10.5)
nRBC: 0 % (ref 0.0–0.2)

## 2021-11-20 LAB — COMPREHENSIVE METABOLIC PANEL
ALT: 50 U/L — ABNORMAL HIGH (ref 0–44)
AST: 35 U/L (ref 15–41)
Albumin: 3.6 g/dL (ref 3.5–5.0)
Alkaline Phosphatase: 51 U/L (ref 38–126)
Anion gap: 9 (ref 5–15)
BUN: 22 mg/dL (ref 8–23)
CO2: 29 mmol/L (ref 22–32)
Calcium: 8.8 mg/dL — ABNORMAL LOW (ref 8.9–10.3)
Chloride: 101 mmol/L (ref 98–111)
Creatinine, Ser: 0.65 mg/dL (ref 0.44–1.00)
GFR, Estimated: >60 mL/min (ref >60)
Glucose, Bld: 262 mg/dL — ABNORMAL HIGH (ref 70–99)
Potassium: 3.8 mmol/L (ref 3.5–5.1)
Sodium: 139 mmol/L (ref 135–145)
Total Bilirubin: 0.5 mg/dL (ref 0.3–1.2)
Total Protein: 6.5 g/dL (ref 6.5–8.1)

## 2021-11-20 LAB — C-REACTIVE PROTEIN: CRP: 0.9 mg/dL (ref <1.0)

## 2021-11-20 LAB — D-DIMER, QUANTITATIVE: D-Dimer, Quant: <0.27 ug/mL-FEU (ref 0.00–0.50)

## 2021-11-20 MED ORDER — ASCORBIC ACID 500 MG PO TABS: 500.0000 mg | ORAL_TABLET | Freq: Every day | ORAL | Status: DC

## 2021-11-20 MED ORDER — NIRMATRELVIR/RITONAVIR (PAXLOVID)TABLET: 3.0000 | ORAL_TABLET | Freq: Two times a day (BID) | ORAL | 0 refills | Status: AC

## 2021-11-20 MED ORDER — PREDNISONE 50 MG PO TABS: 50.0000 mg | ORAL_TABLET | Freq: Every day | ORAL | 0 refills | Status: AC

## 2021-11-20 MED ORDER — ZINC SULFATE 220 (50 ZN) MG PO CAPS: 220.0000 mg | ORAL_CAPSULE | Freq: Every day | ORAL | Status: DC

## 2021-11-20 MED ORDER — DM-GUAIFENESIN ER 30-600 MG PO TB12: 1.0000 | ORAL_TABLET | Freq: Two times a day (BID) | ORAL | 0 refills | Status: DC

## 2021-11-20 MED ORDER — ALBUTEROL SULFATE HFA 108 (90 BASE) MCG/ACT IN AERS
2.0000 | INHALATION_SPRAY | RESPIRATORY_TRACT | 0 refills | Status: AC | PRN
Start: 2021-11-20 — End: ?

## 2021-11-20 MED ORDER — SALINE SPRAY 0.65 % NA SOLN
1.0000 | NASAL | Status: DC | PRN
  Administered 2021-11-20: 1 via NASAL
  Filled 2021-11-20: qty 44

## 2021-11-20 NOTE — Discharge Summary (Signed)
Physician Discharge Summary   Patient: Meredith Caldwell MRN: [id number] DOB: 29-Nov-1945  Admit date:     11/17/2021  Discharge date: 11/20/21  Discharge Physician: Meredith Caldwell   PCP: Meredith Hire, MD   Recommendations at discharge:   Follow-up with primary care provider in 1 week.   Check CBC, BMP, magnesium, LFT in the next visit.  Discharge Diagnoses: Active Problems:   COVID-19 virus infection   Asthma, chronic, unspecified asthma severity, with acute exacerbation   Diabetes mellitus type 2, uncomplicated (HCC)   Obesity   Mood disorder ([place])   HLD (hyperlipidemia)   Essential hypertension   Insomnia   COVID-19  Principal Problem (Resolved):   Acute respiratory failure with hypoxia ([place]) Resolved Problems:   Lactic acidosis  Hospital Course: [name] is a 76 y.o. female with medical history significant of type II DM, HLD, HTN, obesity, mood disorder, asthma presented to hospital with complaints of runny nose, fatigue tiredness and cough.  Patient was found to have COVID-19 infection with pneumonia and was treated with steroids Paxlovid and symptomatic treatment with improvement in her symptoms.   Following conditions were addressed during hospitalization,  Acute respiratory failure with hypoxia (HCC) Acute COVID-19 Viral Pneumonia-CT value of 12.9. Asthma, chronic, unspecified asthma severity, with acute exacerbation  Patient presented 1 day history of fever cough malaise.  Patient was found to have COVID-19 infection.  Pulse ox was 86% on room air on admission.  Chest x-ray showed hazy peripheral opacities.  At this time patient has been weaned off of oxygen.  Paxil which was a started 11/17/2021 and will continue on discharge to complete the course.  Patient also received Solu-Medrol which will be switched to prednisone to complete total 5-day course.  Patient was recommended isolation until 11/28/2021.  We will also be prescribed Mucinex and DM on  discharge.  Lactic acidosis Secondary to dyspnea.  Improved.   Deconditioning/debility. In the setting of COVID-19 infection. Patient was seen by physical therapy on 7/9 and recommended SNF.  At this time patient has progress so we will consider for home health PT on discharge.  Diabetes mellitus type 2, without long-term insulin use, with hyperglycemia uncomplicated (HCC) Hemoglobin A1c 7.4 in May 2023.  We will continue metformin on discharge.  Obesity Body mass index is 38.79 kg/m.  Would benefit from weight loss as outpatient but   Mood disorder (HCC) Continue Paxil.   Hyperlipidemia Continuing statin.   Essential hypertension On losartan at home.   Chronic HFpEF. Patient is on Lasix, potassium and losartan at home   Insomnia Continue Ambien.  Consultants: None Procedures performed: None  Disposition: Home health.  Spoke with the patient's grandson prior to disposition.  Diet recommendation:  Discharge Diet Orders (From admission, onward)     Start     Ordered   11/20/21 0000  Diet - low sodium heart healthy        11/20/21 1153           Cardiac diet DISCHARGE MEDICATION: Allergies as of 11/20/2021       Reactions   Other Diarrhea, Other (See Comments)   Artificial sweetners   Statins    Theophyllines Other (See Comments)   "It makes me feel high"   Zetia [ezetimibe]         Medication List     STOP taking these medications    diclofenac 75 MG EC tablet Commonly known as: VOLTAREN       TAKE these medications  albuterol 108 (90 Base) MCG/ACT inhaler Commonly known as: VENTOLIN HFA Inhale 2 puffs into the lungs every 4 (four) hours as needed for wheezing or shortness of breath. What changed:  how much to take when to take this reasons to take this   ascorbic acid 500 MG tablet Commonly known as: VITAMIN C Take 1 tablet (500 mg total) by mouth daily. Start taking on: November 21, 2021   aspirin EC 81 MG tablet Take 81 mg by  mouth daily.   cetirizine 10 MG tablet Commonly known as: ZYRTEC Take 10 mg by mouth daily.   colesevelam 625 MG tablet Commonly known as: WELCHOL Take 1,875 mg by mouth 2 (two) times daily with a meal.   cyanocobalamin 1000 MCG tablet Take 1 tablet by mouth daily.   dextromethorphan-guaiFENesin 30-600 MG 12hr tablet Commonly known as: MUCINEX DM Take 1 tablet by mouth 2 (two) times daily for 10 days.   FISH OIL-KRILL OIL PO Take 360 mg by mouth.   furosemide 40 MG tablet Commonly known as: LASIX Take 40 mg by mouth daily.   losartan 50 MG tablet Commonly known as: COZAAR take 1 tablet by mouth once daily   metFORMIN 1000 MG tablet Commonly known as: GLUCOPHAGE Take 1,000 mg by mouth 2 (two) times daily with a meal.   montelukast 10 MG tablet Commonly known as: SINGULAIR Take 10 mg by mouth at bedtime.   multivitamin with minerals Tabs tablet Take 1 tablet by mouth daily.   nabumetone 500 MG tablet Commonly known as: RELAFEN Take 500 mg by mouth daily.   naproxen sodium 220 MG tablet Commonly known as: ALEVE Take 220 mg by mouth daily as needed.   nirmatrelvir/ritonavir EUA 20 x 150 MG & 10 x '100MG'$  Tabs Commonly known as: PAXLOVID Take 3 tablets by mouth 2 (two) times daily for 3 days. Patient GFR is 60. Take nirmatrelvir (150 mg) two tablets twice daily for 5 days and ritonavir (100 mg) one tablet twice daily for 5 days.   PARoxetine 25 MG 24 hr tablet Commonly known as: PAXIL-CR Take 25 mg by mouth daily.   penciclovir 1 % cream Commonly known as: DENAVIR Apply 1 application topically every 2 (two) hours as needed.   predniSONE 50 MG tablet Commonly known as: DELTASONE Take 1 tablet (50 mg total) by mouth daily for 3 days.   zinc sulfate 220 (50 Zn) MG capsule Take 1 capsule (220 mg total) by mouth daily. Start taking on: November 21, 2021   zolpidem 10 MG tablet Commonly known as: AMBIEN Take 10 mg by mouth at bedtime as needed.                Durable Medical Equipment  (From admission, onward)           Start     Ordered   11/20/21 1247  For home use only DME 4 wheeled rolling walker with seat  Once       Question:  Patient needs a walker to treat with the following condition  Answer:  Weakness   11/20/21 1246   11/20/21 1244  For home use only DME Shower stool  Once        11/20/21 1244             Follow-up Information     Meredith Hire, MD Follow up in 1 week(s).   Specialty: Internal Medicine Contact information: [address] [place] [address] [phone]  Subjective Today, patient was seen and examined at bedside.  Feels better with breathing.  Was able to ambulate okay.  Has some tiredness.  Discharge Exam: Filed Weights   11/17/21 1800  Weight: 99.3 kg      11/20/2021   10:45 AM 11/20/2021    8:33 AM 11/20/2021    6:42 AM  Vitals with BMI  Systolic 195 093 267  Diastolic 83 92 89  Pulse  72 79    General:  Average built, not in obvious distress HENT:   No scleral pallor or icterus noted. Oral mucosa is moist.  Chest:   Diminished breath sounds bilaterally. No crackles or wheezes.  CVS: S1 &S2 heard. No murmur.  Regular rate and rhythm. Abdomen: Soft, nontender, nondistended.  Bowel sounds are heard.   Extremities: No cyanosis, clubbing or edema.  Peripheral pulses are palpable. Psych: Alert, awake and oriented, normal mood CNS:  No cranial nerve deficits.  Power equal in all extremities.   Skin: Warm and dry.  No rashes noted.   Condition at discharge: good  The results of significant diagnostics from this hospitalization (including imaging, microbiology, ancillary and laboratory) are listed below for reference.   Imaging Studies: DG Chest 2 View  Result Date: 11/17/2021 CLINICAL DATA:  Shortness of breath EXAM: CHEST - 2 VIEW COMPARISON:  Chest x-ray dated October 14th 2008 FINDINGS: Cardiac and mediastinal contours are within normal  limits. Elevation of the right hemidiaphragm. Mild bibasilar opacities, likely due to atelectasis. No pleural effusion or pneumothorax. The visualized skeletal structures are unremarkable. IMPRESSION: Mild bibasilar opacities, likely due to atelectasis. Electronically Signed   By: Yetta Glassman M.D.   On: 11/17/2021 13:02    Microbiology: Results for orders placed or performed during the hospital encounter of 11/17/21  SARS Coronavirus 2 by RT PCR (hospital order, performed in [facility] hospital lab) *cepheid single result test* Anterior Nasal Swab     Status: Abnormal   Collection Time: 11/17/21  1:04 PM   Specimen: Anterior Nasal Swab  Result Value Ref Range Status   SARS Coronavirus 2 by RT PCR POSITIVE (A) NEGATIVE Final    Comment: (NOTE) SARS-CoV-2 target nucleic acids are DETECTED  SARS-CoV-2 RNA is generally detectable in upper respiratory specimens  during the acute phase of infection.  Positive results are indicative  of the presence of the identified virus, but do not rule out bacterial infection or co-infection with other pathogens not detected by the test.  Clinical correlation with patient history and  other diagnostic information is necessary to determine patient infection status.  The expected result is negative.  Fact Sheet for Patients:   https://www.patel.info/   Fact Sheet for Healthcare Providers:   https://hall.com/    This test is not yet approved or cleared by the [place] FDA and  has been authorized for detection and/or diagnosis of SARS-CoV-2 by FDA under an Emergency Use Authorization (EUA).  This EUA will remain in effect (meaning this test can be used) for the duration of  the COVID-19 declaration under Section 564(b)(1)  of the Act, 21 U.S.C. section 360-bbb-3(b)(1), unless the authorization is terminated or revoked sooner.   Performed at [facility], [address] [place], [place]  [address]   Blood culture (routine x 2)     Status: None (Preliminary result)   Collection Time: 11/17/21  4:03 PM   Specimen: BLOOD  Result Value Ref Range Status   Specimen Description BLOOD BLOOD LEFT FOREARM  Final  Special Requests   Final    BOTTLES DRAWN AEROBIC AND ANAEROBIC Blood Culture results may not be optimal due to an inadequate volume of blood received in culture bottles   Culture   Final    NO GROWTH 3 DAYS Performed at [facility], [address] [place], [place] [address]    Report Status PENDING  Incomplete  Blood culture (routine x 2)     Status: None (Preliminary result)   Collection Time: 11/17/21  4:03 PM   Specimen: BLOOD  Result Value Ref Range Status   Specimen Description BLOOD BLOOD LEFT HAND  Final   Special Requests   Final    BOTTLES DRAWN AEROBIC AND ANAEROBIC Blood Culture results may not be optimal due to an inadequate volume of blood received in culture bottles   Culture   Final    NO GROWTH 3 DAYS Performed at [facility], [address] [place],  [address]    Report Status PENDING  Incomplete    Labs: CBC: Recent Labs  Lab 11/17/21 1304 11/18/21 0612 11/19/21 0426 11/20/21 0406  WBC 8.0 6.3 15.0* 15.4*  NEUTROABS 5.8 4.7 13.3* 14.0*  HGB 13.4 13.0 12.5 12.8  HCT 42.0 40.9 39.3 39.4  MCV 92.5 92.5 92.7 91.4  PLT 256 249 252 299   Basic Metabolic Panel: Recent Labs  Lab 11/17/21 1304 11/18/21 0612 11/19/21 0426 11/20/21 0406  NA 139 140 140 139  K 3.9 4.0 4.0 3.8  CL 100 106 102 101  CO2 '24 26 30 29  '$ GLUCOSE 180* 202* 228* 262*  BUN '12 13 16 22  '$ CREATININE 0.72 0.63 0.63 0.65  CALCIUM 9.6 9.3 8.9 8.8*   Liver Function Tests: Recent Labs  Lab 11/17/21 1304 11/18/21 0612 11/19/21 0426 11/20/21 0406  AST 51* 49* 42* 35  ALT 57* 57* 53* 50*  ALKPHOS 54 48 48 51  BILITOT 0.7 0.6 0.6 0.5  PROT 7.5 7.4 6.8 6.5  ALBUMIN 4.3 4.0 3.8 3.6   CBG: No results for input(s): "GLUCAP" in the last  168 hours.  Discharge time spent: greater than 30 minutes.  Signed: Flora Lipps, MD [facility] 11/20/2021

## 2021-11-20 NOTE — Progress Notes (Addendum)
Physical Therapy Treatment Patient Details Name: Meredith Caldwell MRN: [id number] DOB: 04/14/1946 Today's Date: 11/20/2021   History of Present Illness Pt admitted to [facility] on 11/17/21 for c/o wornseing cough, weakness, generalized body aches, and SOB; dx with acute respiratory failure with hypoxia. COVID positive. Imaging revealed bibasilar PNA. Significant PMH includes: T2DM, HLD, HTN, obesity, mood disorder, asthma.    PT Comments    Pt received supine in bed agreeable to PT. Remaining on RA. Reports more fatigue than SOB but feeling better today compared to yesterday. States she wants to return home, does not believe she requires STR care. Pt performing bed mobility mod-I and standing with supervision to RW ambulating with slowed cadence ~45' with supervision with x2 standing rest breaks due to LE fatigue. Requesting to sit EOB due to dizziness. Vitals below noted with elevated diastolic but no BP indicative of orthostasis. Education provided on safe use of rollator for seated rest breaks in home and how to utilize brakes. Alternative options educated on with safe placement of chairs throughout house for seated rest as needed when performing ADL's. TOC updated on DME needs. Plan to d/c home with HHPT and needed equipment to improve independence in ADL's as pt lives alone. Rec HHPT at d/c to improve tolerance for OOB activity ands global deconditioning.    Vitals post PT after onset of dizziness:  BP:120/102 mm Hg (MAP- 109) HR: 76 BPM SPO2: 94%     Recommendations for follow up therapy are one component of a multi-disciplinary discharge planning process, led by the attending physician.  Recommendations may be updated based on patient status, additional functional criteria and insurance authorization.  Follow Up Recommendations  Home health PT Can patient physically be transported by private vehicle: Yes   Assistance Recommended at Discharge Intermittent Supervision/Assistance  Patient can  return home with the following A little help with walking and/or transfers;A little help with bathing/dressing/bathroom;Help with stairs or ramp for entrance;Assist for transportation;Assistance with Landscape architect (4 wheels);BSC/3in1 (tub bench)    Recommendations for Other Services       Precautions / Restrictions Precautions Precautions: Fall Restrictions Weight Bearing Restrictions: No     Mobility  Bed Mobility Overal bed mobility: Modified Independent             General bed mobility comments: to sit EOB, HOB semi-elevated Patient Response: Cooperative  Transfers Overall transfer level: Needs assistance Equipment used: Rolling walker (2 wheels) Transfers: Sit to/from Stand Sit to Stand: Supervision           General transfer comment: for safety, cues for hand placement with RW    Ambulation/Gait Ambulation/Gait assistance: Supervision Gait Distance (Feet): 45 Feet Assistive device: Rolling walker (2 wheels)         General Gait Details: Remains with slowed cadence and decreased reciprocal gait. Occasional standing rest breaks during gait. SPo2 > 90% throughout mobility   Stairs             Wheelchair Mobility    Modified Rankin (Stroke Patients Only)       Balance Overall balance assessment: Needs assistance Sitting-balance support: Feet supported, No upper extremity supported Sitting balance-Leahy Scale: Good     Standing balance support: Bilateral upper extremity supported, During functional activity Standing balance-Leahy Scale: Fair Standing balance comment: BUE support in RW                            Cognition  Arousal/Alertness: Awake/alert Behavior During Therapy: WFL for tasks assessed/performed Overall Cognitive Status: Within Functional Limits for tasks assessed                                          Exercises Other Exercises Other Exercises:  Educated on DME for home, safe use of rollator for seated rest breaks PRN, energy conservation techniques.    General Comments General comments (skin integrity, edema, etc.): SPo2 > 90% at rest and with mobility      Pertinent Vitals/Pain Pain Assessment Pain Assessment: No/denies pain    Home Living                          Prior Function            PT Goals (current goals can now be found in the care plan section) Acute Rehab PT Goals Patient Stated Goal: wishing to go home PT Goal Formulation: With patient Time For Goal Achievement: 12/03/21 Potential to Achieve Goals: Good Progress towards PT goals: Progressing toward goals    Frequency    Min 2X/week      PT Plan Discharge plan needs to be updated    Co-evaluation              AM-PAC PT "6 Clicks" Mobility   Outcome Measure  Help needed turning from your back to your side while in a flat bed without using bedrails?: None Help needed moving from lying on your back to sitting on the side of a flat bed without using bedrails?: None Help needed moving to and from a bed to a chair (including a wheelchair)?: A Little Help needed standing up from a chair using your arms (e.g., wheelchair or bedside chair)?: A Little Help needed to walk in hospital room?: A Little Help needed climbing 3-5 steps with a railing? : A Little 6 Click Score: 20    End of Session Equipment Utilized During Treatment: Gait belt Activity Tolerance: Patient tolerated treatment well;Patient limited by fatigue Patient left:  (seated EOB) Nurse Communication: Mobility status PT Visit Diagnosis: Unsteadiness on feet (R26.81);Muscle weakness (generalized) (M62.81)     Time: 9735-3299 PT Time Calculation (min) (ACUTE ONLY): 18 min  Charges:  $Therapeutic Exercise: 8-22 mins                     Nafeesa Dils M. Fairly IV, PT, DPT Physical Therapist- [facility]  11/20/2021, 11:53 AM

## 2021-11-20 NOTE — Progress Notes (Signed)
Discharge instructions were given to pt. IV and tele monitor was discontinue. We are waiting on family to pick pt. Pt stated family lives 1.5 hrs away from here and it might be a while until they get here.

## 2021-11-20 NOTE — Progress Notes (Signed)
Pt lef the floor with family. Belongings given to pt and family. Not other needs at this time.

## 2021-11-20 NOTE — TOC Initial Note (Signed)
Transition of Care [facility]) - Initial/Assessment Note    Patient Details  Name: Meredith Caldwell MRN: [id number] Date of Birth: 1945/10/03  Transition of Care [facility]) CM/SW Contact:    Beverly Sessions, RN Phone Number: 11/20/2021, 1:31 PM  Clinical Narrative:                  Admitted for: Respiratory failure  Admitted from: Home alone SLP:[id number] Pharmacy: [facility] Current home health/prior home health/DME:   Patient agreeable to Rollator and shower chair, Patient to private pay for shower chair.  Declines BSC.  Patient agreeable to home health services.  States she does not have a preference of home health agency.  Referral made and accepted by Corene Cornea with [facility]        Patient Goals and CMS Choice        Expected Discharge Plan and Services           Expected Discharge Date: 11/20/21                                    Prior Living Arrangements/Services                       Activities of Daily Living Home Assistive Devices/Equipment: Kasandra Knudsen (specify quad or straight) (regular) ADL Screening (condition at time of admission) Patient's cognitive ability adequate to safely complete daily activities?: Yes Is the patient deaf or have difficulty hearing?: No Does the patient have difficulty seeing, even when wearing glasses/contacts?: No Does the patient have difficulty concentrating, remembering, or making decisions?: No Patient able to express need for assistance with ADLs?: Yes Does the patient have difficulty dressing or bathing?: No Independently performs ADLs?: Yes (appropriate for developmental age) Does the patient have difficulty walking or climbing stairs?: Yes Weakness of Legs: Both Weakness of Arms/Hands: Both  Permission Sought/Granted                  Emotional Assessment              Admission diagnosis:  COVID-19 [U07.1] Patient Active Problem List   Diagnosis Date Noted   COVID-19 11/18/2021   COVID-19  virus infection 11/17/2021   Asthma, chronic, unspecified asthma severity, with acute exacerbation 11/17/2021   Mood disorder ([place]) 11/17/2021   HLD (hyperlipidemia) 11/17/2021   Essential hypertension 11/17/2021   Insomnia 11/17/2021   Arthritis of left knee 05/22/2018   Obesity 04/03/2018   Diabetes mellitus type 2, uncomplicated ([place]) 21/03/7355   Chronic pain of left knee 04/03/2018   Chronic pain syndrome 04/03/2018   Primary osteoarthritis of right knee 01/16/2018   PCP:  Baxter Hire, MD Pharmacy:   [facility] [id number] [place] [address] Phone: [phone] Fax: [phone]     Social Determinants of Health (SDOH) Interventions    Readmission Risk Interventions     No data to display

## 2021-11-22 LAB — CULTURE, BLOOD (ROUTINE X 2)
Culture: NO GROWTH
Culture: NO GROWTH

## 2021-11-24 ENCOUNTER — Ambulatory Visit: Payer: Self-pay

## 2021-11-24 ENCOUNTER — Emergency Department: Payer: Medicare Other

## 2021-11-24 ENCOUNTER — Inpatient Hospital Stay
Admission: EM | Admit: 2021-11-24 | Discharge: 2021-11-27 | DRG: 641 | Disposition: A | Discharge status: Home Health | Payer: Medicare Other | Attending: Internal Medicine | Admitting: Internal Medicine

## 2021-11-24 ENCOUNTER — Other Ambulatory Visit: Payer: Self-pay

## 2021-11-24 DIAGNOSIS — Z888 Allergy status to other drugs, medicaments and biological substances status: Secondary | ICD-10-CM

## 2021-11-24 DIAGNOSIS — U099 Post covid-19 condition, unspecified: Secondary | ICD-10-CM | POA: Diagnosis present

## 2021-11-24 DIAGNOSIS — R55 Syncope and collapse: Secondary | ICD-10-CM | POA: Diagnosis present

## 2021-11-24 DIAGNOSIS — Z85820 Personal history of malignant melanoma of skin: Secondary | ICD-10-CM

## 2021-11-24 DIAGNOSIS — E119 Type 2 diabetes mellitus without complications: Secondary | ICD-10-CM

## 2021-11-24 DIAGNOSIS — E876 Hypokalemia: Secondary | ICD-10-CM | POA: Diagnosis present

## 2021-11-24 DIAGNOSIS — R531 Weakness: Secondary | ICD-10-CM | POA: Diagnosis not present

## 2021-11-24 DIAGNOSIS — Z87891 Personal history of nicotine dependence: Secondary | ICD-10-CM

## 2021-11-24 DIAGNOSIS — F39 Unspecified mood [affective] disorder: Secondary | ICD-10-CM | POA: Diagnosis present

## 2021-11-24 DIAGNOSIS — F32A Depression, unspecified: Secondary | ICD-10-CM | POA: Diagnosis present

## 2021-11-24 DIAGNOSIS — R9431 Abnormal electrocardiogram [ECG] [EKG]: Secondary | ICD-10-CM | POA: Diagnosis present

## 2021-11-24 DIAGNOSIS — E878 Other disorders of electrolyte and fluid balance, not elsewhere classified: Secondary | ICD-10-CM | POA: Diagnosis present

## 2021-11-24 DIAGNOSIS — D72829 Elevated white blood cell count, unspecified: Secondary | ICD-10-CM | POA: Diagnosis present

## 2021-11-24 DIAGNOSIS — E860 Dehydration: Principal | ICD-10-CM | POA: Diagnosis present

## 2021-11-24 DIAGNOSIS — J45909 Unspecified asthma, uncomplicated: Secondary | ICD-10-CM

## 2021-11-24 DIAGNOSIS — Z7982 Long term (current) use of aspirin: Secondary | ICD-10-CM

## 2021-11-24 DIAGNOSIS — E785 Hyperlipidemia, unspecified: Secondary | ICD-10-CM | POA: Diagnosis present

## 2021-11-24 DIAGNOSIS — I10 Essential (primary) hypertension: Secondary | ICD-10-CM | POA: Diagnosis present

## 2021-11-24 DIAGNOSIS — R197 Diarrhea, unspecified: Secondary | ICD-10-CM | POA: Diagnosis present

## 2021-11-24 DIAGNOSIS — R627 Adult failure to thrive: Secondary | ICD-10-CM | POA: Diagnosis present

## 2021-11-24 DIAGNOSIS — Z79899 Other long term (current) drug therapy: Secondary | ICD-10-CM

## 2021-11-24 LAB — BASIC METABOLIC PANEL
Anion gap: 12 (ref 5–15)
BUN: 24 mg/dL — ABNORMAL HIGH (ref 8–23)
CO2: 33 mmol/L — ABNORMAL HIGH (ref 22–32)
Calcium: 8.9 mg/dL (ref 8.9–10.3)
Chloride: 90 mmol/L — ABNORMAL LOW (ref 98–111)
Creatinine, Ser: 0.81 mg/dL (ref 0.44–1.00)
GFR, Estimated: >60 mL/min (ref >60)
Glucose, Bld: 231 mg/dL — ABNORMAL HIGH (ref 70–99)
Potassium: 3.2 mmol/L — ABNORMAL LOW (ref 3.5–5.1)
Sodium: 135 mmol/L (ref 135–145)

## 2021-11-24 LAB — CBC
HCT: 41.9 % (ref 36.0–46.0)
Hemoglobin: 14 g/dL (ref 12.0–15.0)
MCH: 29.8 pg (ref 26.0–34.0)
MCHC: 33.4 g/dL (ref 30.0–36.0)
MCV: 89.1 fL (ref 80.0–100.0)
Platelets: 340 10*3/uL (ref 150–400)
RBC: 4.7 MIL/uL (ref 3.87–5.11)
RDW: 13.8 % (ref 11.5–15.5)
WBC: 16 10*3/uL — ABNORMAL HIGH (ref 4.0–10.5)
nRBC: 0.1 % (ref 0.0–0.2)

## 2021-11-24 LAB — TROPONIN I (HIGH SENSITIVITY): Troponin I (High Sensitivity): 45 ng/L — ABNORMAL HIGH (ref <18)

## 2021-11-24 MED ORDER — POTASSIUM CHLORIDE CRYS ER 20 MEQ PO TBCR
40.0000 meq | EXTENDED_RELEASE_TABLET | Freq: Once | ORAL | Status: AC
  Administered 2021-11-24: 40 meq via ORAL
  Filled 2021-11-24: qty 2

## 2021-11-24 MED ORDER — ACETAMINOPHEN 500 MG PO TABS
1000.0000 mg | ORAL_TABLET | Freq: Once | ORAL | Status: AC
  Administered 2021-11-24: 1000 mg via ORAL
  Filled 2021-11-24: qty 2

## 2021-11-24 MED ORDER — SODIUM CHLORIDE 0.9 % IV BOLUS
1000.0000 mL | Freq: Once | INTRAVENOUS | Status: AC
  Administered 2021-11-25: 1000 mL via INTRAVENOUS

## 2021-11-24 NOTE — ED Notes (Signed)
Lab contacted for lab draw.

## 2021-11-24 NOTE — ED Triage Notes (Signed)
Pt to ED via ems with reports that she was diagnosed with COVID on 7/7. Pt reports covid sx's and feels bad.  EMS: 160/70, 66HR, 94%RA, CBG-237.

## 2021-11-24 NOTE — ED Notes (Signed)
Called from lobby by another patient who reports pt was leaning over in wheel chair and fell into the floor. Pt was visualized by this RN laying in floor on her rt side. Pt reports that since she has had the dx of covid shes been weak. Pt denies any pain from fall. Pt was placed in recliner chair and taken to triage for MSE screening by Vladimir Crofts, NP.

## 2021-11-24 NOTE — ED Provider Notes (Signed)
[facility] Provider Note    Event Date/Time   First MD Initiated Contact with Patient 11/24/21 2100     (approximate)   History   Covid+   HPI  Meredith Caldwell is a 76 y.o. female   past medical history of recent COVID-19 infection, diabetes asthma mood disorder hyperlipidemia hypertension presents with generalized weakness.  Patient was recently hospitalized for hypoxic respiratory failure in setting of COVID-19.  Discharged 4 days ago.  She says that she is failed to thrive at home feels extremely fatigued can barely stand up feels lightheaded almost passed out today.  Denies significant shortness of breath does have diarrhea several episodes of nonbloody diarrhea per day no vomiting or abdominal pain denies urinary symptoms.  Patient lives alone.     Past Medical History:  Diagnosis Date   Bone spur 2000   right shoulder   Diabetes ([place]) 2005   Edema 2000   right arm   Melanoma ([place]) 2008   left arm   Neuroma 2006   Right ear   Vaso vagal episode 1992    Patient Active Problem List   Diagnosis Date Noted   COVID-19 11/18/2021   COVID-19 virus infection 11/17/2021   Asthma, chronic, unspecified asthma severity, with acute exacerbation 11/17/2021   Mood disorder ([place]) 11/17/2021   HLD (hyperlipidemia) 11/17/2021   Essential hypertension 11/17/2021   Insomnia 11/17/2021   Arthritis of left knee 05/22/2018   Obesity 04/03/2018   Diabetes mellitus type 2, uncomplicated ([place]) 14/97/0263   Chronic pain of left knee 04/03/2018   Chronic pain syndrome 04/03/2018   Primary osteoarthritis of right knee 01/16/2018     Physical Exam  Triage Vital Signs: ED Triage Vitals  Enc Vitals Group     BP 11/24/21 1615 133/83     Pulse Rate 11/24/21 1615 66     Resp 11/24/21 1615 18     Temp 11/24/21 1615 98 F (36.7 C)     Temp Source 11/24/21 1954 Oral     SpO2 11/24/21 1615 95 %     Weight 11/24/21 1615 218 lb 14.7 oz (99.3 kg)     Height  11/24/21 1615 '5\' 3"'$  (1.6 m)     Head Circumference --      Peak Flow --      Pain Score 11/24/21 1614 6     Pain Loc --      Pain Edu? --      Excl. in [place]? --     Most recent vital signs: Vitals:   11/24/21 2230 11/24/21 2345  BP: (!) 115/50 (!) 123/55  Pulse: 71 80  Resp: 19 14  Temp:    SpO2: 92% 94%     General: Awake, no distress.  CV:  Good peripheral perfusion.  No peripheral edema Resp:  Normal effort.  No increased work of breathing Abd:  No distention.  Abdomen soft nontender Neuro:             Awake, Alert, Oriented x 3  Other:     ED Results / Procedures / Treatments  Labs (all labs ordered are listed, but only abnormal results are displayed) Labs Reviewed  BASIC METABOLIC PANEL - Abnormal; Notable for the following components:      Result Value   Potassium 3.2 (*)    Chloride 90 (*)    CO2 33 (*)    Glucose, Bld 231 (*)    BUN 24 (*)    All other components  within normal limits  CBC - Abnormal; Notable for the following components:   WBC 16.0 (*)    All other components within normal limits  TROPONIN I (HIGH SENSITIVITY) - Abnormal; Notable for the following components:   Troponin I (High Sensitivity) 45 (*)    All other components within normal limits  URINALYSIS, ROUTINE W REFLEX MICROSCOPIC  CBG MONITORING, ED     EKG  EKG reviewed and interpreted by myself shows sinus rhythm with PVCs and sinus arrhythmia normal axis normal intervals no acute ischemic changes   RADIOLOGY I reviewed and interpreted the CXR which does not show any acute cardiopulmonary process    PROCEDURES:  Critical Care performed: No  .1-3 Lead EKG Interpretation  Performed by: Rada Hay, MD Authorized by: Rada Hay, MD     Interpretation: normal     ECG rate assessment: normal     Rhythm: sinus rhythm     Ectopy: none     Conduction: normal     The patient is on the cardiac monitor to evaluate for evidence of arrhythmia and/or significant  heart rate changes.   MEDICATIONS ORDERED IN ED: Medications  sodium chloride 0.9 % bolus 1,000 mL (has no administration in time range)  potassium chloride SA (KLOR-CON M) CR tablet 40 mEq (40 mEq Oral Given 11/24/21 2339)  acetaminophen (TYLENOL) tablet 1,000 mg (1,000 mg Oral Given 11/24/21 2338)     IMPRESSION / MDM / ASSESSMENT AND PLAN / ED COURSE  I reviewed the triage vital signs and the nursing notes.                              Patient's presentation is most consistent with acute complicated illness / injury requiring diagnostic workup.  Differential diagnosis includes, but is not limited to, volume depletion dehydration, AKI, electrolyte abnormality, ACS, pneumonia, fatigue related to recent COVID, deconditioning   Patient is a 76 year old female presenting today with failure to thrive after recent admission for COVID with hypoxic respiratory failure.  Patient was discharged on 710 after being admitted with COVID-19 initially requiring oxygen but was weaned off.  She has done poorly at home with multiple falls feeling lightheaded and generally weak poor appetite with diarrhea.  Also complaining of headache.  She denies chest pain or significant dyspnea.  I see from patient's discharge summary that there was discussion of potential SNF due to deconditioning but plan was for outpatient PT/home health patient says she has not received this.  Her vitals are within normal limits although oxygen does at times become borderline in the low 90s.  Patient looks chronically unwell but not acutely toxic on exam.  EKG nonischemic chest x-ray without infiltrate.  Labs are notable for leukocytosis of 16 suspect that this is steroid effect she is mildly hypokalemic with a potassium of 3.2 hyperglycemic to 230 but no signs of DKA.  Troponin is 45.  Patient was given a liter of fluid and p.o. potassium Tylenol for headache.  Given her significant weakness and lightheadedness failure to thrive with  positive troponin borderline sats do think she is not appropriate for discharge.  Will discuss with hospitalist admission.     FINAL CLINICAL IMPRESSION(S) / ED DIAGNOSES   Final diagnoses:  Weakness     Rx / DC Orders   ED Discharge Orders     None        Note:  This document was prepared using Dragon  voice recognition software and may include unintentional dictation errors.   Rada Hay, MD 11/25/21 0000

## 2021-11-24 NOTE — ED Triage Notes (Signed)
Patient to ER via [facility] with reports of weakness, headache and diarrhea. Reports she was recently hospitalized with covid and discharged but lives alone and reports that she is so weak she is unable to care for herself.

## 2021-11-24 NOTE — ED Provider Triage Note (Signed)
Emergency Medicine Provider Triage Evaluation Note  Meredith Caldwell , a 76 y.o. female  was evaluated in triage.  Pt complains of headache, diarrhea, and  weakness. Diagnosed with Covid a week ago. Lives alone and can't take care of herself.  Physical Exam  Ht '5\' 3"'$  (1.6 m)   Wt 99.3 kg   BMI 38.78 kg/m  Gen:   Awake, no distress   Resp:  Normal effort  MSK:   Moves extremities without difficulty  Other:    Medical Decision Making  Medically screening exam initiated at 4:15 PM.  Appropriate orders placed.  Charina Fons was informed that the remainder of the evaluation will be completed by another provider, this initial triage assessment does not replace that evaluation, and the importance of remaining in the ED until their evaluation is complete.   Victorino Dike, FNP 11/24/21 1757

## 2021-11-24 NOTE — Telephone Encounter (Signed)
    Chief Complaint: Diarrhea,dizziness, weakness. Recently hospitalized with COVID 19. Symptoms: Above Frequency: Today Pertinent Negatives: Patient denies fever Disposition: '[x]'$ ED /'[]'$ Urgent Care (no appt availability in office) / '[]'$ Appointment(In office/virtual)/ '[]'$  [facility]/ '[]'$ Home Care/ '[]'$ Refused Recommended Disposition /'[]'$ [facility]/ '[]'$  Follow-up with PCP Additional Notes: Pt. Lives alone, no one to help her. Called EMS for pt. Stayed on phone until EMS arrived.  Reason for Disposition  Sounds like a life-threatening emergency to the triager  Answer Assessment - Initial Assessment Questions 1. DIARRHEA SEVERITY: "How bad is the diarrhea?" "How many more stools have you had in the past 24 hours than normal?"    - NO DIARRHEA (SCALE 0)   - MILD (SCALE 1-3): Few loose or mushy BMs; increase of 1-3 stools over normal daily number of stools; mild increase in ostomy output.   -  MODERATE (SCALE 4-7): Increase of 4-6 stools daily over normal; moderate increase in ostomy output. * SEVERE (SCALE 8-10; OR 'WORST POSSIBLE'): Increase of 7 or more stools daily over normal; moderate increase in ostomy output; incontinence.     Every 30 minutes 2. ONSET: "When did the diarrhea begin?"      Today 3. BM CONSISTENCY: "How loose or watery is the diarrhea?"      Loose 4. VOMITING: "Are you also vomiting?" If Yes, ask: "How many times in the past 24 hours?"      No 5. ABDOMINAL PAIN: "Are you having any abdominal pain?" If Yes, ask: "What does it feel like?" (e.g., crampy, dull, intermittent, constant)      Cramping 6. ABDOMINAL PAIN SEVERITY: If present, ask: "How bad is the pain?"  (e.g., Scale 1-10; mild, moderate, or severe)   - MILD (1-3): doesn't interfere with normal activities, abdomen soft and not tender to touch    - MODERATE (4-7): interferes with normal activities or awakens from sleep, abdomen tender to touch    - SEVERE (8-10): excruciating pain, doubled over,  unable to do any normal activities       Mild 7. ORAL INTAKE: If vomiting, "Have you been able to drink liquids?" "How much liquids have you had in the past 24 hours?"     Some water 8. HYDRATION: "Any signs of dehydration?" (e.g., dry mouth [not just dry lips], too weak to stand, dizziness, new weight loss) "When did you last urinate?"     No 9. EXPOSURE: "Have you traveled to a foreign country recently?" "Have you been exposed to anyone with diarrhea?" "Could you have eaten any food that was spoiled?"     No 10. ANTIBIOTIC USE: "Are you taking antibiotics now or have you taken antibiotics in the past 2 months?"       Yes 11. OTHER SYMPTOMS: "Do you have any other symptoms?" (e.g., fever, blood in stool)       Dizziness 12. PREGNANCY: "Is there any chance you are pregnant?" "When was your last menstrual period?"       No  Protocols used: [facility]

## 2021-11-25 ENCOUNTER — Encounter: Payer: Self-pay | Admitting: Family Medicine

## 2021-11-25 ENCOUNTER — Inpatient Hospital Stay: Payer: Medicare Other

## 2021-11-25 DIAGNOSIS — R55 Syncope and collapse: Secondary | ICD-10-CM | POA: Diagnosis not present

## 2021-11-25 DIAGNOSIS — Z7982 Long term (current) use of aspirin: Secondary | ICD-10-CM | POA: Diagnosis not present

## 2021-11-25 DIAGNOSIS — R197 Diarrhea, unspecified: Secondary | ICD-10-CM | POA: Diagnosis present

## 2021-11-25 DIAGNOSIS — E119 Type 2 diabetes mellitus without complications: Secondary | ICD-10-CM

## 2021-11-25 DIAGNOSIS — F39 Unspecified mood [affective] disorder: Secondary | ICD-10-CM | POA: Diagnosis present

## 2021-11-25 DIAGNOSIS — E785 Hyperlipidemia, unspecified: Secondary | ICD-10-CM

## 2021-11-25 DIAGNOSIS — I10 Essential (primary) hypertension: Secondary | ICD-10-CM | POA: Diagnosis present

## 2021-11-25 DIAGNOSIS — F32A Depression, unspecified: Secondary | ICD-10-CM | POA: Diagnosis present

## 2021-11-25 DIAGNOSIS — J4520 Mild intermittent asthma, uncomplicated: Secondary | ICD-10-CM | POA: Diagnosis not present

## 2021-11-25 DIAGNOSIS — D72829 Elevated white blood cell count, unspecified: Secondary | ICD-10-CM | POA: Diagnosis present

## 2021-11-25 DIAGNOSIS — E876 Hypokalemia: Secondary | ICD-10-CM | POA: Diagnosis present

## 2021-11-25 DIAGNOSIS — Z87891 Personal history of nicotine dependence: Secondary | ICD-10-CM | POA: Diagnosis not present

## 2021-11-25 DIAGNOSIS — E860 Dehydration: Secondary | ICD-10-CM | POA: Diagnosis present

## 2021-11-25 DIAGNOSIS — E878 Other disorders of electrolyte and fluid balance, not elsewhere classified: Secondary | ICD-10-CM | POA: Diagnosis present

## 2021-11-25 DIAGNOSIS — R9431 Abnormal electrocardiogram [ECG] [EKG]: Secondary | ICD-10-CM | POA: Diagnosis present

## 2021-11-25 DIAGNOSIS — Z888 Allergy status to other drugs, medicaments and biological substances status: Secondary | ICD-10-CM | POA: Diagnosis not present

## 2021-11-25 DIAGNOSIS — Z85820 Personal history of malignant melanoma of skin: Secondary | ICD-10-CM | POA: Diagnosis not present

## 2021-11-25 DIAGNOSIS — J45909 Unspecified asthma, uncomplicated: Secondary | ICD-10-CM

## 2021-11-25 DIAGNOSIS — R627 Adult failure to thrive: Secondary | ICD-10-CM | POA: Diagnosis present

## 2021-11-25 DIAGNOSIS — R531 Weakness: Secondary | ICD-10-CM | POA: Diagnosis present

## 2021-11-25 DIAGNOSIS — U099 Post covid-19 condition, unspecified: Secondary | ICD-10-CM | POA: Diagnosis present

## 2021-11-25 DIAGNOSIS — Z79899 Other long term (current) drug therapy: Secondary | ICD-10-CM | POA: Diagnosis not present

## 2021-11-25 LAB — GASTROINTESTINAL PANEL BY PCR, STOOL (REPLACES STOOL CULTURE)

## 2021-11-25 LAB — C DIFFICILE QUICK SCREEN W PCR REFLEX
C Diff antigen: NEGATIVE
C Diff interpretation: NOT DETECTED
C Diff toxin: NEGATIVE

## 2021-11-25 LAB — URINALYSIS, ROUTINE W REFLEX MICROSCOPIC
Bilirubin Urine: NEGATIVE
Glucose, UA: 150 mg/dL — AB
Hgb urine dipstick: NEGATIVE
Ketones, ur: 5 mg/dL — AB
Nitrite: POSITIVE — AB
Protein, ur: NEGATIVE mg/dL
Specific Gravity, Urine: 1.025 (ref 1.005–1.030)
pH: 6 (ref 5.0–8.0)

## 2021-11-25 LAB — CBC
HCT: 37.2 % (ref 36.0–46.0)
Hemoglobin: 12.1 g/dL (ref 12.0–15.0)
MCH: 29.7 pg (ref 26.0–34.0)
MCHC: 32.5 g/dL (ref 30.0–36.0)
MCV: 91.2 fL (ref 80.0–100.0)
Platelets: 336 10*3/uL (ref 150–400)
RBC: 4.08 MIL/uL (ref 3.87–5.11)
RDW: 14.2 % (ref 11.5–15.5)
WBC: 13.9 10*3/uL — ABNORMAL HIGH (ref 4.0–10.5)
nRBC: 0 % (ref 0.0–0.2)

## 2021-11-25 LAB — BASIC METABOLIC PANEL
Anion gap: 9 (ref 5–15)
BUN: 28 mg/dL — ABNORMAL HIGH (ref 8–23)
CO2: 35 mmol/L — ABNORMAL HIGH (ref 22–32)
Calcium: 8.4 mg/dL — ABNORMAL LOW (ref 8.9–10.3)
Chloride: 95 mmol/L — ABNORMAL LOW (ref 98–111)
Creatinine, Ser: 0.76 mg/dL (ref 0.44–1.00)
GFR, Estimated: >60 mL/min (ref >60)
Glucose, Bld: 206 mg/dL — ABNORMAL HIGH (ref 70–99)
Potassium: 3 mmol/L — ABNORMAL LOW (ref 3.5–5.1)
Sodium: 139 mmol/L (ref 135–145)

## 2021-11-25 LAB — TROPONIN I (HIGH SENSITIVITY)
Troponin I (High Sensitivity): 66 ng/L — ABNORMAL HIGH (ref <18)
Troponin I (High Sensitivity): 53 ng/L — ABNORMAL HIGH (ref <18)

## 2021-11-25 MED ORDER — ACETAMINOPHEN 650 MG RE SUPP: 650.0000 mg | Freq: Four times a day (QID) | RECTAL | Status: DC | PRN

## 2021-11-25 MED ORDER — NABUMETONE 500 MG PO TABS
500.0000 mg | ORAL_TABLET | Freq: Every day | ORAL | Status: DC
  Administered 2021-11-25 – 2021-11-27 (×3): 500 mg via ORAL
  Filled 2021-11-25 (×3): qty 1

## 2021-11-25 MED ORDER — LORATADINE 10 MG PO TABS
10.0000 mg | ORAL_TABLET | Freq: Every day | ORAL | Status: DC
  Administered 2021-11-25 – 2021-11-27 (×3): 10 mg via ORAL
  Filled 2021-11-25 (×3): qty 1

## 2021-11-25 MED ORDER — ADULT MULTIVITAMIN W/MINERALS CH
1.0000 | ORAL_TABLET | Freq: Every day | ORAL | Status: DC
  Administered 2021-11-25 – 2021-11-27 (×3): 1 via ORAL
  Filled 2021-11-25 (×3): qty 1

## 2021-11-25 MED ORDER — ALBUTEROL SULFATE (2.5 MG/3ML) 0.083% IN NEBU: 2.5000 mg | INHALATION_SOLUTION | RESPIRATORY_TRACT | Status: DC | PRN

## 2021-11-25 MED ORDER — ZOLPIDEM TARTRATE 5 MG PO TABS: 5.0000 mg | ORAL_TABLET | Freq: Every evening | ORAL | Status: DC | PRN

## 2021-11-25 MED ORDER — LOSARTAN POTASSIUM 50 MG PO TABS
50.0000 mg | ORAL_TABLET | Freq: Every day | ORAL | Status: DC
  Administered 2021-11-25 – 2021-11-27 (×3): 50 mg via ORAL
  Filled 2021-11-25 (×3): qty 1

## 2021-11-25 MED ORDER — ONDANSETRON HCL 4 MG/2ML IJ SOLN: 4.0000 mg | Freq: Four times a day (QID) | INTRAMUSCULAR | Status: DC | PRN

## 2021-11-25 MED ORDER — POTASSIUM CHLORIDE 20 MEQ PO PACK
40.0000 meq | PACK | Freq: Once | ORAL | Status: AC
  Administered 2021-11-25: 40 meq via ORAL
  Filled 2021-11-25: qty 2

## 2021-11-25 MED ORDER — ENOXAPARIN SODIUM 60 MG/0.6ML IJ SOSY
0.5000 mg/kg | PREFILLED_SYRINGE | INTRAMUSCULAR | Status: DC
  Administered 2021-11-25: 50 mg via SUBCUTANEOUS
  Filled 2021-11-25 (×3): qty 0.6

## 2021-11-25 MED ORDER — ZINC SULFATE 220 (50 ZN) MG PO CAPS
220.0000 mg | ORAL_CAPSULE | Freq: Every day | ORAL | Status: DC
  Administered 2021-11-25 – 2021-11-27 (×3): 220 mg via ORAL
  Filled 2021-11-25 (×3): qty 1

## 2021-11-25 MED ORDER — ALBUTEROL SULFATE HFA 108 (90 BASE) MCG/ACT IN AERS: 2.0000 | INHALATION_SPRAY | RESPIRATORY_TRACT | Status: DC | PRN

## 2021-11-25 MED ORDER — ASPIRIN 81 MG PO TBEC
81.0000 mg | DELAYED_RELEASE_TABLET | Freq: Every day | ORAL | Status: DC
  Administered 2021-11-25 – 2021-11-27 (×3): 81 mg via ORAL
  Filled 2021-11-25 (×3): qty 1

## 2021-11-25 MED ORDER — ASCORBIC ACID 500 MG PO TABS
500.0000 mg | ORAL_TABLET | Freq: Every day | ORAL | Status: DC
  Administered 2021-11-25 – 2021-11-27 (×3): 500 mg via ORAL
  Filled 2021-11-25 (×3): qty 1

## 2021-11-25 MED ORDER — TRAZODONE HCL 50 MG PO TABS: 25.0000 mg | ORAL_TABLET | Freq: Every evening | ORAL | Status: DC | PRN

## 2021-11-25 MED ORDER — ONDANSETRON HCL 4 MG PO TABS: 4.0000 mg | ORAL_TABLET | Freq: Four times a day (QID) | ORAL | Status: DC | PRN

## 2021-11-25 MED ORDER — ACETAMINOPHEN 325 MG PO TABS: 650.0000 mg | ORAL_TABLET | Freq: Four times a day (QID) | ORAL | Status: DC | PRN

## 2021-11-25 MED ORDER — DM-GUAIFENESIN ER 30-600 MG PO TB12
1.0000 | ORAL_TABLET | Freq: Two times a day (BID) | ORAL | Status: DC
  Administered 2021-11-25 – 2021-11-27 (×5): 1 via ORAL
  Filled 2021-11-25 (×5): qty 1

## 2021-11-25 MED ORDER — MAGNESIUM HYDROXIDE 400 MG/5ML PO SUSP: 30.0000 mL | Freq: Every day | ORAL | Status: DC | PRN

## 2021-11-25 MED ORDER — VITAMIN B-12 1000 MCG PO TABS
1000.0000 ug | ORAL_TABLET | Freq: Every day | ORAL | Status: DC
  Administered 2021-11-25 – 2021-11-27 (×3): 1000 ug via ORAL
  Filled 2021-11-25 (×3): qty 1

## 2021-11-25 MED ORDER — COLESEVELAM HCL 625 MG PO TABS
1875.0000 mg | ORAL_TABLET | Freq: Two times a day (BID) | ORAL | Status: DC
Start: 2021-11-25 — End: 2021-11-27
  Administered 2021-11-25 – 2021-11-27 (×5): 1875 mg via ORAL
  Filled 2021-11-25 (×6): qty 3

## 2021-11-25 MED ORDER — DIPHENOXYLATE-ATROPINE 2.5-0.025 MG PO TABS
1.0000 | ORAL_TABLET | Freq: Four times a day (QID) | ORAL | Status: DC | PRN
  Administered 2021-11-25: 1 via ORAL
  Filled 2021-11-25 (×4): qty 1

## 2021-11-25 MED ORDER — PAROXETINE HCL ER 12.5 MG PO TB24
25.0000 mg | ORAL_TABLET | Freq: Every day | ORAL | Status: DC
  Administered 2021-11-25 – 2021-11-26 (×2): 25 mg via ORAL
  Filled 2021-11-25 (×3): qty 2

## 2021-11-25 MED ORDER — POTASSIUM CHLORIDE IN NACL 20-0.9 MEQ/L-% IV SOLN
INTRAVENOUS | Status: DC
  Filled 2021-11-25 (×4): qty 1000

## 2021-11-25 MED ORDER — MONTELUKAST SODIUM 10 MG PO TABS
10.0000 mg | ORAL_TABLET | Freq: Every day | ORAL | Status: DC
  Administered 2021-11-25 – 2021-11-26 (×2): 10 mg via ORAL
  Filled 2021-11-25 (×2): qty 1

## 2021-11-25 NOTE — Assessment & Plan Note (Addendum)
-   This is associated with generalized weakness and failure to thrive.  This could be residual from recent COVID-19 infection - The patient will be admitted to a medical telemetry bed. - We will hydrate with IV normal saline. - We will obtain physical therapy evaluation and consider case management consult for SNF placement as needed. - We will replace potassium. - We will follow serial troponins. - She will be monitored for arrhythmias. - 2D echo will be obtained and carotid Doppler.

## 2021-11-25 NOTE — Assessment & Plan Note (Signed)
-   We will continue WelChol.

## 2021-11-25 NOTE — Assessment & Plan Note (Signed)
-   The patient will be placed on supplement coverage with NovoLog. - We will hold off Glucophage.

## 2021-11-25 NOTE — ED Notes (Signed)
Pt. At bedside

## 2021-11-25 NOTE — Assessment & Plan Note (Addendum)
-   We will place the patient on as needed IV labetalol. - We will continue Cozaar.

## 2021-11-25 NOTE — Assessment & Plan Note (Signed)
We will continue Paxil CR

## 2021-11-25 NOTE — Assessment & Plan Note (Signed)
-   We will continue Singulair and as needed albuterol.

## 2021-11-25 NOTE — Progress Notes (Signed)
PHARMACIST - PHYSICIAN COMMUNICATION  CONCERNING:  Enoxaparin (Lovenox) for DVT Prophylaxis    RECOMMENDATION: Patient was prescribed enoxaprin '40mg'$  q24 hours for VTE prophylaxis.   Filed Weights   11/24/21 1615  Weight: 99.3 kg (218 lb 14.7 oz)    Body mass index is 38.78 kg/m.  Estimated Creatinine Clearance: 68.5 mL/min (by C-G formula based on SCr of 0.81 mg/dL).   Based on [place] patient is candidate for enoxaparin 0.'5mg'$ /kg TBW SQ every 24 hours based on BMI being >30.  DESCRIPTION: Pharmacy has adjusted enoxaparin dose per [facility] policy.  Patient is now receiving enoxaparin 0.5 mg/kg every 24 hours   Renda Rolls, PharmD, [facility] 11/25/2021 4:30 AM

## 2021-11-25 NOTE — ED Notes (Signed)
Pt assisted onto bed pan. Pt had diarrhea. Pt was cleaned up, new brief placed

## 2021-11-25 NOTE — Evaluation (Signed)
Occupational Therapy Evaluation Patient Details Name: Meredith Caldwell MRN: [id number] DOB: 10-20-1945 Today's Date: 11/25/2021   History of Present Illness Pt is a 76 y/o F admitted on 11/24/21 after presenting to the ED with c/o acute onset of failure to thrive & significant weakness. Pt also had lightheadedness & recurrent syncope. Pt with recent admission for tx of COVID 19 with hypoxic respiratory failure & was d/c 4 days ago. PMH: DM2, HTN, asthma, COVID-19, OA, dyslipidemia, depression, R acoustic neuroma with resection   Clinical Impression   Chart revived, RN cleared pt for participation in OT evaluation. Co tx completed with PT on this date as pt presents with poor activity tolerance for all tasks on this date due to acute illness. PTA pt reports she is MOD I in ADL/IADL however since acute illness (that brought  her to the ED previously) however she feels significantly weaker than baseline. Pt requires MIN A for bed mobility, MIN A for STS +1-2 via HHA, two steps up the bed with MIN A +2 however pt unable to tolerate further activity. Grooming tasks completed with SET UP. At this time pt presents with deficits in strength, endurance, activity tolerance all affecting safe and optimal ADL completion. Recommend discharge to STR to address deficits and to facilitate return to PLOF. Pt is left as received, NAD, all needs met. OT will follow acutely.   BP checked in LUE: Sitting EOB: 144/66 mmHg MAP 86, HR 91 bpm Sitting EOB after standing ~1 minute: 136/70 mmHg MAP 88, HR 94    Recommendations for follow up therapy are one component of a multi-disciplinary discharge planning process, led by the attending physician.  Recommendations may be updated based on patient status, additional functional criteria and insurance authorization.   Follow Up Recommendations  Skilled nursing-short term rehab (<3 hours/day)    Assistance Recommended at Discharge Frequent or constant Supervision/Assistance   Patient can return home with the following A lot of help with walking and/or transfers;A lot of help with bathing/dressing/bathroom    Functional Status Assessment  Patient has had a recent decline in their functional status and demonstrates the ability to make significant improvements in function in a reasonable and predictable amount of time.  Equipment Recommendations  Other (comment) (per next venue of care)    Recommendations for Other Services       Precautions / Restrictions Precautions Precautions: Fall Restrictions Weight Bearing Restrictions: No      Mobility Bed Mobility Overal bed mobility: Needs Assistance Bed Mobility: Supine to Sit, Sit to Supine     Supine to sit: Min assist, HOB elevated Sit to supine: Min assist, HOB elevated        Transfers Overall transfer level: Needs assistance Equipment used: 1 person hand held assist, 2 person hand held assist Transfers: Sit to/from Stand Sit to Stand: Min assist, +2 physical assistance                  Balance Overall balance assessment: Needs assistance Sitting-balance support: Feet supported, No upper extremity supported Sitting balance-Leahy Scale: Fair     Standing balance support: Bilateral upper extremity supported, During functional activity Standing balance-Leahy Scale: Poor                             ADL either performed or assessed with clinical judgement   ADL Overall ADL's : Needs assistance/impaired Eating/Feeding: Set up;Sitting   Grooming: Wash/dry face;Set up Grooming Details (indicate cue  type and reason): washing face bed level             Lower Body Dressing: Maximal assistance                       Vision Patient Visual Report: No change from baseline       Perception     Praxis      Pertinent Vitals/Pain Pain Assessment Pain Assessment: No/denies pain     Hand Dominance     Extremity/Trunk Assessment Upper Extremity  Assessment Upper Extremity Assessment: Generalized weakness   Lower Extremity Assessment Lower Extremity Assessment: Generalized weakness       Communication Communication Communication: No difficulties (Simultaneous filing. User may not have seen previous data.)   Cognition                                             General Comments  pt unable to tolerate further mobility following standing at edge of bed for no more than 30 seconds    Exercises Other Exercises Other Exercises: edu re: role of OT, role of rehab, discharge recommendations   Shoulder Instructions      Home Living Family/patient expects to be discharged to:: Private residence (Simultaneous filing. User may not have seen previous data.) Living Arrangements: Alone (Simultaneous filing. User may not have seen previous data.) Available Help at Discharge: Friend(s);Available PRN/intermittently (Simultaneous filing. User may not have seen previous data.) Type of Home: House (Simultaneous filing. User may not have seen previous data.) Home Access: Level entry (Simultaneous filing. User may not have seen previous data.)     Home Layout: One level (Simultaneous filing. User may not have seen previous data.)     Bathroom Shower/Tub: Tub only (Simultaneous filing. User may not have seen previous data.)   Bathroom Toilet: Standard Bathroom Accessibility: Yes   Home Equipment: Standard Walker;Cane - single point;BSC/3in1;Electric scooter;Wheelchair - power (Simultaneous filing. User may not have seen previous data.)   Additional Comments: pt reports all equipment is from the 90's; reports that power WC needs a new battery      Prior Functioning/Environment Prior Level of Function : Independent/Modified Independent (Simultaneous filing. User may not have seen previous data.)             Mobility Comments: Prior to acute illness pt amb with [facility] home/short community distances (Simultaneous filing.  User may not have seen previous data.) ADLs Comments: pt reports MOD I for ADL/IADL, has acleaner        OT Problem List: Decreased strength;Decreased activity tolerance      OT Treatment/Interventions: Self-care/ADL training;Patient/family education;Therapeutic exercise;Balance training;Energy conservation;Therapeutic activities;DME and/or AE instruction    OT Goals(Current goals can be found in the care plan section) Acute Rehab OT Goals Patient Stated Goal: get stronger OT Goal Formulation: With patient Time For Goal Achievement: 12/09/21 Potential to Achieve Goals: Good ADL Goals Pt Will Perform Grooming: sitting;standing;with modified independence Pt Will Perform Lower Body Dressing: with modified independence Pt Will Transfer to Toilet: with modified independence;ambulating Pt Will Perform Toileting - Clothing Manipulation and hygiene: with modified independence;sit to/from stand  OT Frequency: Min 2X/week    Co-evaluation PT/OT/SLP Co-Evaluation/Treatment: Yes Reason for Co-Treatment: Complexity of the patient's impairments (multi-system involvement);For patient/therapist safety PT goals addressed during session: Mobility/safety with mobility;Balance OT goals addressed during session: ADL's and self-care  AM-PAC OT "6 Clicks" Daily Activity     Outcome Measure Help from another person eating meals?: None Help from another person taking care of personal grooming?: None Help from another person toileting, which includes using toliet, bedpan, or urinal?: A Lot Help from another person bathing (including washing, rinsing, drying)?: A Lot Help from another person to put on and taking off regular upper body clothing?: A Little Help from another person to put on and taking off regular lower body clothing?: A Lot 6 Click Score: 17   End of Session    Activity Tolerance: Patient tolerated treatment well Patient left: in bed;with call bell/phone within reach  OT Visit  Diagnosis: Unsteadiness on feet (R26.81);Muscle weakness (generalized) (M62.81)                Time: 3729-0211 OT Time Calculation (min): 17 min Charges:  OT General Charges $OT Visit: 1 Visit OT Evaluation $OT Eval Low Complexity: 1 Low Shanon Payor, OTD OTR/L  11/25/21, 3:09 PM

## 2021-11-25 NOTE — Progress Notes (Signed)
Patient seen chart reviewed. No family at bedside. Recently discharged four days ago after she was admitted with COVID infection. She reports finishing her oral antiviral. Came in with generalized weakness, failure to thrive and diarrhea that started a couple days ago. Low potassium being replaced. Patient lives at home by herself. Home health was arrange at discharge last week.  Stool studies negative. Will continue home meds PRN lomotil  PT OT to see and discharge planning.

## 2021-11-25 NOTE — ED Notes (Signed)
Pt. Had another BM of liquid, black, malodorous stool. Peri care provided, pt's brief changed, repositioned for comfort. Denies further need at this time.

## 2021-11-25 NOTE — Plan of Care (Signed)
  Problem: Coping: Goal: Level of anxiety will decrease Outcome: Progressing   Problem: Elimination: Goal: Will not experience complications related to bowel motility Outcome: Progressing   Problem: Pain Managment: Goal: General experience of comfort will improve Outcome: Progressing   Problem: Safety: Goal: Ability to remain free from injury will improve Outcome: Progressing   

## 2021-11-25 NOTE — ED Notes (Signed)
Pt assisted onto bedpan. Pt had diarrhea. Pt cleaned up, new brief placed. Pt repositioned

## 2021-11-25 NOTE — ED Notes (Signed)
Pt. Had diarrhea episode. Stool is liquid, black, malodorous and pt. Is having cramping with diarrhea. Dr. Posey Pronto notified, states send for c-diff panel.

## 2021-11-25 NOTE — ED Notes (Signed)
Pt laying in bed, lights dimmed, pt repositioned for comfort. Pt denies any further need.

## 2021-11-25 NOTE — ED Notes (Signed)
Unable to collect urine currently r/t pt. Urinates while on the bedpan and has liquid diarrhea. Pt. Has not urinated using purewick.  Will attempt to obtain later.

## 2021-11-25 NOTE — ED Notes (Signed)
Pt. Placed on bedpan again. This [facility] pharmacy again about lomotil dose.

## 2021-11-25 NOTE — H&P (Addendum)
Home     [place]   PATIENT NAME: Meredith Caldwell    MR#:  [id number]  DATE OF BIRTH:  09/29/1945  DATE OF ADMISSION:  11/24/2021  PRIMARY CARE PHYSICIAN: Baxter Hire, MD   Patient is coming from: Home  REQUESTING/REFERRING PHYSICIAN: Rada Hay, MD  CHIEF COMPLAINT:   Chief Complaint  Patient presents with   Covid+    HISTORY OF PRESENT ILLNESS:  Meredith Caldwell is a 76 y.o. Caucasian female with medical history significant for type 2 diabetes mellitus, essential hypertension, asthma, COVID-19, osteoarthritis, dyslipidemia and depression, presented to the emergency room with acute onset of failure to thrive with significant generalized weakness and inability to ambulate and difficulty standing.  She had lightheadedness and recurrent syncope.  She was recently admitted here for COVID-19 and hypoxic respiratory failure, managed and discharged 4 days ago.  She admits to diarrhea as well as residual dry cough and chills without measured fever.  No nausea or vomiting or abdominal pain.  No chest pain or palpitations. She denies any dyspnea, orthopnea or paroxysmal nocturnal dyspnea.  No dysuria, oliguria or hematuria or flank pain.  ED Course: When she came to the ER, vital signs were within normal.  Labs revealed mild hypokalemia 3.2 and hypochloremia of 90 with a CO2 of 33 and a blood glucose of 231, BUN of 24 and high sensitive troponin I of 45.  CBC showed leukocytosis of 16. EKG as reviewed by me : EKG showed sinus rhythm with a rate of 88 with sinus arrhythmia and prolonged QT interval with QTc of 515 MS, poor R wave progression. Imaging: Noncontrast head CT scan revealed right acoustic neuroma resection and otherwise no acute intracranial abnormality.  The patient was given 1 g of p.o. Tylenol, 40 M EQ p.o. potassium chloride and 1 L bolus of IV lactated Ringer.  She will be admitted to a medical telemetry bed for further evaluation and management. PAST MEDICAL  HISTORY:   Past Medical History:  Diagnosis Date   Bone spur 2000   right shoulder   Diabetes ([place]) 2005   Edema 2000   right arm   Melanoma ([place]) 2008   left arm   Neuroma 2006   Right ear   Vaso vagal episode 1992  Essential hypertension, asthma, COVID-19, osteoarthritis, dyslipidemia and depression  PAST SURGICAL HISTORY:   Past Surgical History:  Procedure Laterality Date   ABDOMINAL HYSTERECTOMY  [place]   right chest, left brest   CHOLECYSTECTOMY  1972   EXCISION/RELEASE BURSA HIP Left 1997   KNEE SURGERY Right 2013   LIVER BIOPSY  2005   MELANOMA EXCISION Left 2008   arm; cancerous   TONSILLECTOMY AND ADENOIDECTOMY  1950    SOCIAL HISTORY:   Social History   Tobacco Use   Smoking status: Former    Types: Cigarettes    Quit date: 04/03/1969    Years since quitting: 52.6   Smokeless tobacco: Never  Substance Use Topics   Alcohol use: Yes    Comment: "SOCIAL DRINKER"; ONCE A MONTH    FAMILY HISTORY:  No family history on file.  No familial diseases.  DRUG ALLERGIES:   Allergies  Allergen Reactions   Other Diarrhea and Other (See Comments)    Artificial sweetners   Statins    Theophyllines Other (See Comments)    "It makes me feel high"   Zetia [Ezetimibe]     REVIEW OF  SYSTEMS:   ROS As per history of present illness. All pertinent systems were reviewed above. Constitutional, HEENT, cardiovascular, respiratory, GI, GU, musculoskeletal, neuro, psychiatric, endocrine, integumentary and hematologic systems were reviewed and are otherwise negative/unremarkable except for positive findings mentioned above in the HPI.   MEDICATIONS AT HOME:   Prior to Admission medications   Medication Sig Start Date End Date Taking? Authorizing Provider  albuterol (VENTOLIN HFA) 108 (90 Base) MCG/ACT inhaler Inhale 2 puffs into the lungs every 4 (four) hours as needed for wheezing or shortness of breath. 11/20/21  Yes  Pokhrel, Laxman, MD  ascorbic acid (VITAMIN C) 500 MG tablet Take 1 tablet (500 mg total) by mouth daily. 11/21/21 12/21/21 Yes Pokhrel, Laxman, MD  aspirin EC 81 MG tablet Take 81 mg by mouth daily.   Yes [provider]  cetirizine (ZYRTEC) 10 MG tablet Take 10 mg by mouth daily.   Yes [provider]  colesevelam (WELCHOL) 625 MG tablet Take 1,875 mg by mouth 2 (two) times daily with a meal. 03/27/18  Yes [provider]  cyanocobalamin 1000 MCG tablet Take 1 tablet by mouth daily.   Yes [provider]  dextromethorphan-guaiFENesin (MUCINEX DM) 30-600 MG 12hr tablet Take 1 tablet by mouth 2 (two) times daily for 10 days. 11/20/21 11/30/21 Yes Pokhrel, Laxman, MD  FISH OIL-KRILL OIL PO Take 360 mg by mouth daily.   Yes [provider]  furosemide (LASIX) 40 MG tablet Take 40 mg by mouth daily. 10/30/17  Yes [provider]  losartan (COZAAR) 50 MG tablet take 1 tablet by mouth once daily 06/26/17  Yes [provider]  metFORMIN (GLUCOPHAGE) 1000 MG tablet Take 1,000 mg by mouth 2 (two) times daily with a meal.   Yes [provider]  montelukast (SINGULAIR) 10 MG tablet Take 10 mg by mouth at bedtime.   Yes [provider]  Multiple Vitamin (MULTIVITAMIN WITH MINERALS) TABS tablet Take 1 tablet by mouth daily.   Yes [provider]  nabumetone (RELAFEN) 500 MG tablet Take 500 mg by mouth daily.   Yes [provider]  naproxen sodium (ALEVE) 220 MG tablet Take 220 mg by mouth daily as needed.   Yes [provider]  PARoxetine (PAXIL-CR) 25 MG 24 hr tablet Take 25 mg by mouth daily. 10/20/21  Yes [provider]  penciclovir (DENAVIR) 1 % cream Apply 1 application topically every 2 (two) hours as needed.   Yes [provider]  zinc sulfate 220 (50 Zn) MG capsule Take 1 capsule (220 mg total) by mouth daily. 11/21/21 12/21/21 Yes Pokhrel, Laxman, MD  zolpidem (AMBIEN) 10 MG tablet Take  10 mg by mouth at bedtime as needed. 11/01/21  Yes [provider]      VITAL SIGNS:  Blood pressure 124/76, pulse (!) 104, temperature 98.5 F (36.9 C), temperature source Oral, resp. rate 18, height '5\' 3"'$  (1.6 m), weight 99.3 kg, SpO2 95 %.  PHYSICAL EXAMINATION:  Physical Exam  GENERAL:  76 y.o.-year-old Caucasian female patient lying in the bed with no acute distress.  EYES: Pupils equal, round, reactive to light and accommodation. No scleral icterus. Extraocular muscles intact.  HEENT: Head atraumatic, normocephalic. Oropharynx and nasopharynx clear.  NECK:  Supple, no jugular venous distention. No thyroid enlargement, no tenderness.  LUNGS: Slightly diminished bibasilar breath sounds with minimal basal crackles, no wheezing, rales,rhonchi or crepitation. No use of accessory muscles of respiration.  CARDIOVASCULAR: Regular rate and rhythm, S1, S2 normal. No murmurs,  rubs, or gallops.  ABDOMEN: Soft, nondistended, nontender. Bowel sounds present. No organomegaly or mass.  EXTREMITIES: No pedal edema, cyanosis, or clubbing.  NEUROLOGIC: Cranial nerves II through XII are intact. Muscle strength 5/5 in all extremities. Sensation intact. Gait not checked.  PSYCHIATRIC: The patient is alert and oriented x 3.  Normal affect and good eye contact. SKIN: No obvious rash, lesion, or ulcer.   LABORATORY PANEL:   CBC Recent Labs  Lab 11/24/21 1744  WBC 16.0*  HGB 14.0  HCT 41.9  PLT 340   ------------------------------------------------------------------------------------------------------------------  Chemistries  Recent Labs  Lab 11/20/21 0406 11/24/21 1744  NA 139 135  K 3.8 3.2*  CL 101 90*  CO2 29 33*  GLUCOSE 262* 231*  BUN 22 24*  CREATININE 0.65 0.81  CALCIUM 8.8* 8.9  AST 35  --   ALT 50*  --   ALKPHOS 51  --   BILITOT 0.5  --    ------------------------------------------------------------------------------------------------------------------  Cardiac  Enzymes No results for input(s): "TROPONINI" in the last 168 hours. ------------------------------------------------------------------------------------------------------------------  RADIOLOGY:  CT HEAD WO CONTRAST (5MM)  Result Date: 11/24/2021 CLINICAL DATA:  Head trauma, minor (Age >= 65y). Generalized weakness, headache. EXAM: CT HEAD WITHOUT CONTRAST TECHNIQUE: Contiguous axial images were obtained from the base of the skull through the vertex without intravenous contrast. RADIATION DOSE REDUCTION: This exam was performed according to the departmental dose-optimization program which includes automated exposure control, adjustment of the mA and/or kV according to patient size and/or use of iterative reconstruction technique. COMPARISON:  None Available. FINDINGS: Brain: Right suboccipital craniotomy and mastoidectomy has been performed in keeping with history of a right acoustic neuroma resection. No acute intracranial hemorrhage or infarct. No abnormal mass effect or midline shift. No abnormal intra or extra-axial mass lesion or fluid collection. Ventricular size is normal. Cerebellum is unremarkable. Vascular: No asymmetric hyperdense vasculature at the skull base. Skull: No acute fracture Sinuses/Orbits: Paranasal sinuses are clear. Visualized orbits are unremarkable. Other: Left mastoid air cells and middle ear cavities are clear. IMPRESSION: 1. No acute intracranial abnormality. No calvarial fracture. 2. Status post right acoustic neuroma resection. Electronically Signed   By: Fidela Salisbury M.D.   On: 11/24/2021 23:46   DG Chest Portable 1 View  Result Date: 11/24/2021 CLINICAL DATA:  Shortness of breath EXAM: PORTABLE CHEST 1 VIEW COMPARISON:  11/17/2021 FINDINGS: Heart and mediastinal contours are within normal limits. No focal opacities or effusions. No acute bony abnormality. IMPRESSION: No active disease. Electronically Signed   By: Rolm Baptise M.D.   On: 11/24/2021 23:07       IMPRESSION AND PLAN:  Assessment and Plan: * Recurrent syncope - This is associated with generalized weakness and failure to thrive.  This could be residual from recent COVID-19 infection - The patient will be admitted to a medical telemetry bed. - We will hydrate with IV normal saline. - We will obtain physical therapy evaluation and consider case management consult for SNF placement as needed. - We will replace potassium. - We will follow serial troponins. - She will be monitored for arrhythmias. - 2D echo will be obtained and carotid Doppler.   Diabetes mellitus type 2, uncomplicated ([place]) - The patient will be placed on supplement coverage with NovoLog. - We will hold off Glucophage.  Asthma, chronic - We will continue Singulair and as needed albuterol.  Essential hypertension - We will place the patient on as needed IV labetalol. - We will continue Cozaar.  HLD (hyperlipidemia) - We will  continue WelChol.  Mood disorder ([place]) We will continue Paxil CR    DVT prophylaxis: Lovenox.  Advanced Care Planning:  Code Status: Patient is DNI only.   Family Communication:  The plan of care was discussed in details with the patient (and family). I answered all questions. The patient agreed to proceed with the above mentioned plan. Further management will depend upon hospital course. Disposition Plan: Back to previous home environment Consults called: none.  All the records are reviewed and case discussed with ED provider.  Status is: Inpatient    At the time of the admission, it appears that the appropriate admission status for this patient is inpatient.  This is judged to be reasonable and necessary in order to provide the required intensity of service to ensure the patient's safety given the presenting symptoms, physical exam findings and initial radiographic and laboratory data in the context of comorbid conditions.  The patient requires inpatient status due to high  intensity of service, high risk of further deterioration and high frequency of surveillance required.  I certify that at the time of admission, it is my clinical judgment that the patient will require inpatient hospital care extending more than 2 midnights.                            Dispo: The patient is from: Home              Anticipated d/c is to: Home              Patient currently is not medically stable to d/c.              Difficult to place patient: No  Christel Mormon M.D on 11/25/2021 at 4:51 AM  [facility]   From 7 PM-7 AM, contact night-coverage [email or web address]  CC: Primary care physician; Baxter Hire, MD

## 2021-11-25 NOTE — Evaluation (Signed)
Physical Therapy Evaluation Patient Details Name: Meredith Caldwell MRN: [id number] DOB: 1945/09/14 Today's Date: 11/25/2021  History of Present Illness  Pt is a 76 y/o F admitted on 11/24/21 after presenting to the ED with c/o acute onset of failure to thrive & significant weakness. Pt also had lightheadedness & recurrent syncope. Pt with recent admission for tx of COVID 19 with hypoxic respiratory failure & was d/c 4 days ago. PMH: DM2, HTN, asthma, COVID-19, OA, dyslipidemia, depression, R acoustic neuroma with resection  Clinical Impression  Pt seen for PT evaluation with co-tx with OT. Pt reports she has experienced a decline in functional mobility following recent d/c home & has no one at home to assist. On this date, pt requires min assist with HOB elevated to transfer supine<>sit & min assist HHA +1-2 for STS from elevated EOB & to take side steps to R. Pt demonstrates poor activity tolerance as she is unable to stand long enough to obtain BP in standing but pt does endorse feeling lightheaded in sitting & standing. At this time, pt is unsafe to d/c home alone & would benefit from STR upon d/c to maximize independence with functional mobility & reduce fall risk prior to return home.    BP checked in LUE: Sitting EOB: 144/66 mmHg MAP 86, HR 91 bpm Sitting EOB after standing ~1 minute: 136/70 mmHg MAP 88, HR 94    Recommendations for follow up therapy are one component of a multi-disciplinary discharge planning process, led by the attending physician.  Recommendations may be updated based on patient status, additional functional criteria and insurance authorization.  Follow Up Recommendations Skilled nursing-short term rehab (<3 hours/day) Can patient physically be transported by private vehicle: No    Assistance Recommended at Discharge Frequent or constant Supervision/Assistance  Patient can return home with the following  A lot of help with bathing/dressing/bathroom;A lot of help with  walking and/or transfers;Assistance with cooking/housework;Help with stairs or ramp for entrance    Equipment Recommendations None recommended by PT  Recommendations for Other Services       Functional Status Assessment Patient has had a recent decline in their functional status and demonstrates the ability to make significant improvements in function in a reasonable and predictable amount of time.     Precautions / Restrictions Precautions Precautions: Fall Restrictions Weight Bearing Restrictions: No      Mobility  Bed Mobility Overal bed mobility: Needs Assistance Bed Mobility: Supine to Sit, Sit to Supine     Supine to sit: Min assist, HOB elevated Sit to supine: Min assist, HOB elevated        Transfers Overall transfer level: Needs assistance Equipment used: 1 person hand held assist, 2 person hand held assist Transfers: Sit to/from Stand Sit to Stand: Min assist, +2 physical assistance           General transfer comment: STS from EOB with +1-2 HHA    Ambulation/Gait                  Stairs            Wheelchair Mobility    Modified Rankin (Stroke Patients Only)       Balance Overall balance assessment: Needs assistance Sitting-balance support: Feet supported, No upper extremity supported Sitting balance-Leahy Scale: Fair Sitting balance - Comments: supervision static sitting   Standing balance support: Bilateral upper extremity supported, During functional activity Standing balance-Leahy Scale: Poor  Pertinent Vitals/Pain Pain Assessment Pain Assessment: No/denies pain    Home Living Family/patient expects to be discharged to:: Private residence (Simultaneous filing. User may not have seen previous data.) Living Arrangements: Alone (Simultaneous filing. User may not have seen previous data.) Available Help at Discharge: Friend(s);Available PRN/intermittently (Simultaneous filing. User  may not have seen previous data.) Type of Home: House (Simultaneous filing. User may not have seen previous data.) Home Access: Level entry (Simultaneous filing. User may not have seen previous data.)       Home Layout: One level (Simultaneous filing. User may not have seen previous data.) Home Equipment: Standard Walker;Cane - single point;BSC/3in1;Electric scooter;Wheelchair - power (Simultaneous filing. User may not have seen previous data.) Additional Comments: pt reports all equipment is from the 90's; reports that power WC needs a new battery    Prior Function Prior Level of Function : Independent/Modified Independent (Simultaneous filing. User may not have seen previous data.)             Mobility Comments: Prior to acute illness pt amb with SPC home/short community distances (Simultaneous filing. User may not have seen previous data.) ADLs Comments: pt reports MOD I for ADL/IADL, has acleaner     Hand Dominance        Extremity/Trunk Assessment   Upper Extremity Assessment Upper Extremity Assessment: Generalized weakness    Lower Extremity Assessment Lower Extremity Assessment: Generalized weakness       Communication   Communication: No difficulties (Simultaneous filing. User may not have seen previous data.)  Cognition Arousal/Alertness: Awake/alert Behavior During Therapy: WFL for tasks assessed/performed Overall Cognitive Status: Within Functional Limits for tasks assessed                                          General Comments General comments (skin integrity, edema, etc.): pt unable to tolerate further mobility following standing at edge of bed for no more than 30 seconds    Exercises     Assessment/Plan    PT Assessment Patient needs continued PT services  PT Problem List Decreased strength;Decreased activity tolerance;Decreased balance;Decreased mobility;Cardiopulmonary status limiting activity;Decreased knowledge of use of  DME       PT Treatment Interventions Gait training;Stair training;Functional mobility training;Therapeutic activities;Therapeutic exercise;Balance training;Neuromuscular re-education;DME instruction;Patient/family education    PT Goals (Current goals can be found in the Care Plan section)  Acute Rehab PT Goals Patient Stated Goal: get better PT Goal Formulation: With patient Time For Goal Achievement: 12/09/21 Potential to Achieve Goals: Good    Frequency Min 2X/week     Co-evaluation PT/OT/SLP Co-Evaluation/Treatment: Yes Reason for Co-Treatment: Complexity of the patient's impairments (multi-system involvement);For patient/therapist safety PT goals addressed during session: Mobility/safety with mobility;Balance OT goals addressed during session: ADL's and self-care       AM-PAC PT "6 Clicks" Mobility  Outcome Measure Help needed turning from your back to your side while in a flat bed without using bedrails?: A Little Help needed moving from lying on your back to sitting on the side of a flat bed without using bedrails?: A Little Help needed moving to and from a bed to a chair (including a wheelchair)?: A Little Help needed standing up from a chair using your arms (e.g., wheelchair or bedside chair)?: A Little Help needed to walk in hospital room?: A Little Help needed climbing 3-5 steps with a railing? : A Lot 6 Click Score:  17    End of Session   Activity Tolerance: Patient tolerated treatment well;Patient limited by fatigue Patient left: in bed;with call bell/phone within reach   PT Visit Diagnosis: Unsteadiness on feet (R26.81);Muscle weakness (generalized) (M62.81);Difficulty in walking, not elsewhere classified (R26.2)    Time: 2353-6144 PT Time Calculation (min) (ACUTE ONLY): 17 min   Charges:   PT Evaluation $PT Eval Low Complexity: [place], PT, DPT 11/25/21, 3:05 PM   Waunita Schooner 11/25/2021, 3:03 PM

## 2021-11-26 DIAGNOSIS — R55 Syncope and collapse: Secondary | ICD-10-CM | POA: Diagnosis not present

## 2021-11-26 NOTE — Progress Notes (Signed)
[name] NAME: Meredith Caldwell    MR#:  [id number]  DATE OF BIRTH:  1945-12-30  SUBJECTIVE:   Feels a lot better. Diarrhea now mushy and slowed down. Eating better   VITALS:  Blood pressure (!) 143/71, pulse 90, temperature 98 F (36.7 C), resp. rate 20, height '5\' 3"'$  (1.6 m), weight 99.3 kg, SpO2 97 %.  PHYSICAL EXAMINATION:   GENERAL:  76 y.o.-year-old patient lying in the bed with no acute distress. Morbidly obese LUNGS: Normal breath sounds bilaterally, no wheezing, rales, rhonchi.  CARDIOVASCULAR: S1, S2 normal. No murmurs, rubs, or gallops.  ABDOMEN: Soft, nontender, nondistended. Bowel sounds present.  EXTREMITIES: No  edema b/l.    NEUROLOGIC: nonfocal  patient is alert and awake SKIN: No obvious rash, lesion, or ulcer.   LABORATORY PANEL:  CBC Recent Labs  Lab 11/25/21 0610  WBC 13.9*  HGB 12.1  HCT 37.2  PLT 336    Chemistries  Recent Labs  Lab 11/20/21 0406 11/24/21 1744 11/25/21 0610  NA 139   < > 139  K 3.8   < > 3.0*  CL 101   < > 95*  CO2 29   < > 35*  GLUCOSE 262*   < > 206*  BUN 22   < > 28*  CREATININE 0.65   < > 0.76  CALCIUM 8.8*   < > 8.4*  AST 35  --   --   ALT 50*  --   --   ALKPHOS 51  --   --   BILITOT 0.5  --   --    < > = values in this interval not displayed.   Cardiac Enzymes No results for input(s): "TROPONINI" in the last 168 hours. RADIOLOGY:  US Carotid Bilateral  Result Date: 11/25/2021 CLINICAL DATA:  Recurrent syncope EXAM: BILATERAL CAROTID DUPLEX ULTRASOUND TECHNIQUE: Pearline Cables scale imaging, color Doppler and duplex ultrasound were performed of bilateral carotid and vertebral arteries in the neck. COMPARISON:  None Available. FINDINGS: Criteria: Quantification of carotid stenosis is based on velocity parameters that correlate the residual internal carotid diameter with NASCET-based stenosis levels, using the diameter of the distal internal carotid lumen as the  denominator for stenosis measurement. The following velocity measurements were obtained: RIGHT ICA: 93 cm/sec CCA: 527 cm/sec SYSTOLIC ICA/CCA RATIO:  1.1 ECA: 129 cm/sec LEFT ICA: 80 cm/sec CCA: 83 cm/sec SYSTOLIC ICA/CCA RATIO:  1.0 ECA: 84 cm/sec RIGHT CAROTID ARTERY: No significant narrowing. RIGHT VERTEBRAL ARTERY:  Patent with antegrade flow. LEFT CAROTID ARTERY:  No significant narrowing. LEFT VERTEBRAL ARTERY:  Patent with antegrade flow. IMPRESSION: No significant stenosis is seen in carotid arteries on both sides. Both vertebral arteries are patent with antegrade flow. Electronically Signed   By: Elmer Picker M.D.   On: 11/25/2021 12:51   CT HEAD WO CONTRAST (5MM)  Result Date: 11/24/2021 CLINICAL DATA:  Head trauma, minor (Age >= 65y). Generalized weakness, headache. EXAM: CT HEAD WITHOUT CONTRAST TECHNIQUE: Contiguous axial images were obtained from the base of the skull through the vertex without intravenous contrast. RADIATION DOSE REDUCTION: This exam was performed according to the departmental dose-optimization program which includes automated exposure control, adjustment of the mA and/or kV according to patient size and/or use of iterative reconstruction technique. COMPARISON:  None Available. FINDINGS: Brain: Right suboccipital craniotomy and mastoidectomy has been performed in keeping with history of a right acoustic neuroma resection. No acute intracranial hemorrhage or infarct. No abnormal mass  effect or midline shift. No abnormal intra or extra-axial mass lesion or fluid collection. Ventricular size is normal. Cerebellum is unremarkable. Vascular: No asymmetric hyperdense vasculature at the skull base. Skull: No acute fracture Sinuses/Orbits: Paranasal sinuses are clear. Visualized orbits are unremarkable. Other: Left mastoid air cells and middle ear cavities are clear. IMPRESSION: 1. No acute intracranial abnormality. No calvarial fracture. 2. Status post right acoustic neuroma  resection. Electronically Signed   By: Fidela Salisbury M.D.   On: 11/24/2021 23:46   DG Chest Portable 1 View  Result Date: 11/24/2021 CLINICAL DATA:  Shortness of breath EXAM: PORTABLE CHEST 1 VIEW COMPARISON:  11/17/2021 FINDINGS: Heart and mediastinal contours are within normal limits. No focal opacities or effusions. No acute bony abnormality. IMPRESSION: No active disease. Electronically Signed   By: Rolm Baptise M.D.   On: 11/24/2021 23:07    Assessment and Plan Meredith Caldwell is a 76 y.o. Caucasian female with medical history significant for type 2 diabetes mellitus, essential hypertension, asthma, COVID-19, osteoarthritis, dyslipidemia and depression, presented to the emergency room with acute onset of failure to thrive with significant generalized weakness and inability to ambulate and difficulty standing.  She had lightheadedness and recurrent syncope.    Recurrent syncope suspected due to GI loses with dehydration from diarrhea with recent COVID - associated with generalized weakness and failure to thrive.  This could be residual from recent COVID-19 infection - received IV normal saline. - PT seen pt--recommends rehab. Pt requesting to go stay with 4 friends in [place]. TOC consulted --she was able to use walker and standup around her bed today  Diabetes mellitus type 2, uncomplicated ([place]) -  on supplement coverage with NovoLog. - hold Glucophage.   Asthma, chronic - continue Singulair and as needed albuterol.   Essential hypertension - on as needed IV labetalol. - continue Cozaar.   HLD (hyperlipidemia) - continue WelChol.   Mood disorder ([place]) - continue Paxil CR       DVT prophylaxis: Lovenox.  Advanced Care Planning:  Code Status: Patient is DNI only.    TOC for d/c planning  Family communication :none Consults :none CODE STATUS: DNI DVT Prophylaxis :enoxaparin Level of care: Med-Surg Status is: Inpatient Remains inpatient appropriate because:  weakness and dehydration TOC for d/c planning likely 7/17    TOTAL TIME TAKING CARE OF THIS PATIENT: 35 minutes.  >50% time spent on counselling and coordination of care  Note: This dictation was prepared with Dragon dictation along with smaller phrase technology. Any transcriptional errors that result from this process are unintentional.  Fritzi Mandes M.D    [facility]   CC: Primary care physician; Baxter Hire, MD

## 2021-11-26 NOTE — Plan of Care (Signed)
  Problem: Education: Goal: Knowledge of risk factors and measures for prevention of condition will improve Outcome: Progressing   Problem: Coping: Goal: Psychosocial and spiritual needs will be supported Outcome: Progressing   Problem: Respiratory: Goal: Will maintain a patent airway Outcome: Progressing Goal: Complications related to the disease process, condition or treatment will be avoided or minimized Outcome: Progressing   

## 2021-11-26 NOTE — TOC Initial Note (Signed)
Transition of Care [facility]) - Initial/Assessment Note    Patient Details  Name: Meredith Caldwell MRN: [id number] Date of Birth: [birth year]  [facility]) CM/SW Contact:    Izola Price, RN Phone Number: 11/26/2021, 3:59 PM  Clinical Narrative:  7/16: Patient's physical address on discharge will be [address] [place], [place] [address]. Patient wants scripts in hard copy form to take to an [facility], provider notified. DC 7/17 expected. Friends she is staying with can transport to appointments, grocery store, pharmacy, she has no issues paying for medications. Dr. Harrel Lemon PCP is current at [facility]. Simmie Davies RN CM                      Patient Goals and CMS Choice   CMS Medicare.gov Compare Post Acute Care list provided to:: Patient Choice offered to / list presented to : Patient  Expected Discharge Plan and Services                           DME Arranged: Oxygen DME Agency: [employer] Date DME Agency Contacted: 11/26/21 Time DME Agency Contacted: [phone] Representative spoke with at DME Agency: [place]: PT [place]: [place] ([place]) Date [place]: 11/26/21 Time [place]: 1437 Representative spoke with at [place]: Corene Cornea (Physical address will be [address], [place], [place] [address].)  Prior Living Arrangements/Services                       Activities of Daily Living Home Assistive Devices/Equipment: Eyeglasses, Gilford Rile (specify type) ADL Screening (condition at time of admission) Patient's cognitive ability adequate to safely complete daily activities?: Yes Is the patient deaf or have difficulty hearing?: No Does the patient have difficulty seeing, even when wearing glasses/contacts?: No Does the patient have difficulty concentrating, remembering, or making decisions?: No Patient able to express need for assistance with ADLs?: Yes Does the patient have difficulty dressing or  bathing?: No Independently performs ADLs?: Yes (appropriate for developmental age) Does the patient have difficulty walking or climbing stairs?: No Weakness of Legs: Both Weakness of Arms/Hands: Both  Permission Sought/Granted                  Emotional Assessment              Admission diagnosis:  Weakness [R53.1] Recurrent syncope [R55] Patient Active Problem List   Diagnosis Date Noted   Recurrent syncope 11/25/2021   Asthma, chronic 11/25/2021   COVID-19 11/18/2021   COVID-19 virus infection 11/17/2021   Asthma, chronic, unspecified asthma severity, with acute exacerbation 11/17/2021   Mood disorder ([place]) 11/17/2021   HLD (hyperlipidemia) 11/17/2021   Essential hypertension 11/17/2021   Insomnia 11/17/2021   Arthritis of left knee 05/22/2018   Obesity 04/03/2018   Diabetes mellitus type 2, uncomplicated ([place]) 37/62/8315   Chronic pain of left knee 04/03/2018   Chronic pain syndrome 04/03/2018   Primary osteoarthritis of right knee 01/16/2018   PCP:  Baxter Hire, MD Pharmacy:   [facility] [phone], [place] AT [facility] OF [address] [place] [address] Phone: [phone] Fax: [phone]     Social Determinants of Health (SDOH) Interventions    Readmission Risk Interventions     No data to display

## 2021-11-26 NOTE — Plan of Care (Signed)
  Problem: Health Behavior/Discharge Planning: Goal: Ability to manage health-related needs will improve Outcome: Progressing   Problem: Activity: Goal: Risk for activity intolerance will decrease Outcome: Progressing   Problem: Nutrition: Goal: Adequate nutrition will be maintained Outcome: Progressing   Problem: Coping: Goal: Level of anxiety will decrease Outcome: Progressing   

## 2021-11-26 NOTE — TOC Progression Note (Addendum)
Transition of Care [facility]) - Progression Note    Patient Details  Name: Meredith Caldwell MRN: [id number] Date of Birth: 05-20-1945  [facility]) CM/SW Contact  Izola Price, RN Phone Number: 11/26/2021, 12:55 PM  Clinical Narrative: 7/16: Per provider patient wants to receive [facility] services in [place] where she will stay with friends. [place] to seek resources at [phone] but only open on weekends. Asking other [place] agencies for suggestions. DC Monday 11/27/21. Simmie Davies RN CM    UPDATE: [facility] will accept and patient agreed for [facility] services to be delivered at [address] [place], [place] [address]. Patient wants scripts in hard copy form to take to [place], provider notified. DC 7/17 expected. Friends she is staying with can transport to appointments, grocery store, pharmacy, she has no issues paying for medications. Dr. Harrel Lemon PCP is current at [facility]. Simmie Davies RN CM 230 pm       Expected Discharge Plan and Services                                                 Social Determinants of Health (SDOH) Interventions    Readmission Risk Interventions     No data to display

## 2021-11-27 DIAGNOSIS — R55 Syncope and collapse: Secondary | ICD-10-CM | POA: Diagnosis not present

## 2021-11-27 LAB — BASIC METABOLIC PANEL
Anion gap: 7 (ref 5–15)
BUN: 15 mg/dL (ref 8–23)
CO2: 30 mmol/L (ref 22–32)
Calcium: 8.4 mg/dL — ABNORMAL LOW (ref 8.9–10.3)
Chloride: 105 mmol/L (ref 98–111)
Creatinine, Ser: 0.63 mg/dL (ref 0.44–1.00)
GFR, Estimated: >60 mL/min (ref >60)
Glucose, Bld: 174 mg/dL — ABNORMAL HIGH (ref 70–99)
Potassium: 3.5 mmol/L (ref 3.5–5.1)
Sodium: 142 mmol/L (ref 135–145)

## 2021-11-27 MED ORDER — DIPHENOXYLATE-ATROPINE 2.5-0.025 MG PO TABS
1.0000 | ORAL_TABLET | Freq: Four times a day (QID) | ORAL | 0 refills | Status: AC | PRN
Start: 2021-11-27 — End: ?

## 2021-11-27 NOTE — Discharge Summary (Signed)
Physician Discharge Summary   Patient: Meredith Caldwell MRN: [id number] DOB: [birth year]  Admit date:     11/24/2021  Discharge date: 11/27/21  Discharge Physician: Fritzi Mandes   PCP: Baxter Hire, MD   Recommendations at discharge:    F/u PCP in 1-2 weeks  Discharge Diagnoses: generalized weakness with recon syncope suspected due to dehydration from diarrhea recent COVID infection  Hospital Course: Meredith Caldwell is a 76 y.o. Caucasian female with medical history significant for type 2 diabetes mellitus, essential hypertension, asthma, COVID-19, osteoarthritis, dyslipidemia and depression, presented to the emergency room with acute onset of failure to thrive with significant generalized weakness and inability to ambulate and difficulty standing.  She had lightheadedness and recurrent syncope.     Recurrent syncope suspected due to GI loses with dehydration from diarrhea with recent COVID - associated with generalized weakness and failure to thrive.  This could be residual from recent COVID-19 infection - received IV normal saline. - PT seen pt--recommends rehab. Pt requesting to go stay with 4 friends in [place]. TOC consulted--HH has been arranged --she was able to use walker and standup around her bed today --overall feeling better. She has RW   Diabetes mellitus type 2, uncomplicated ([place]) -  on supplement coverage with NovoLog. - resumed po meds   Asthma, chronic - continue Singulair and as needed albuterol.   Essential hypertension - on as needed IV labetalol. - continue Cozaar.   HLD (hyperlipidemia) - continue WelChol.   Mood disorder ([place]) - continue Paxil CR    d/c home with HH--pt agreeable   DVT prophylaxis: Lovenox.  Advanced Care Planning:  Code Status: Patient is DNI only.    CODE STATUS: DNI DVT Prophylaxis :enoxaparin Level of care: Med-Surg Status is: Inpatient        Diet recommendation:  Discharge Diet Orders (From admission,  onward)     Start     Ordered   11/27/21 0000  Diet - low sodium heart healthy        11/27/21 0945           Cardiac and Carb modified diet DISCHARGE MEDICATION: Allergies as of 11/27/2021       Reactions   Other Diarrhea, Other (See Comments)   Artificial sweetners   Statins    Theophyllines Other (See Comments)   "It makes me feel high"   Zetia [ezetimibe]         Medication List     STOP taking these medications    ascorbic acid 500 MG tablet Commonly known as: VITAMIN C   dextromethorphan-guaiFENesin 30-600 MG 12hr tablet Commonly known as: MUCINEX DM   zinc sulfate 220 (50 Zn) MG capsule       TAKE these medications    albuterol 108 (90 Base) MCG/ACT inhaler Commonly known as: VENTOLIN HFA Inhale 2 puffs into the lungs every 4 (four) hours as needed for wheezing or shortness of breath.   aspirin EC 81 MG tablet Take 81 mg by mouth daily.   cetirizine 10 MG tablet Commonly known as: ZYRTEC Take 10 mg by mouth daily.   colesevelam 625 MG tablet Commonly known as: WELCHOL Take 1,875 mg by mouth 2 (two) times daily with a meal.   cyanocobalamin 1000 MCG tablet Take 1 tablet by mouth daily.   diphenoxylate-atropine 2.5-0.025 MG tablet Commonly known as: LOMOTIL Take 1 tablet by mouth 4 (four) times daily as needed for diarrhea or loose stools.   FISH OIL-KRILL OIL PO Take 360 mg  by mouth daily.   furosemide 40 MG tablet Commonly known as: LASIX Take 40 mg by mouth daily.   losartan 50 MG tablet Commonly known as: COZAAR take 1 tablet by mouth once daily   metFORMIN 1000 MG tablet Commonly known as: GLUCOPHAGE Take 1,000 mg by mouth 2 (two) times daily with a meal.   montelukast 10 MG tablet Commonly known as: SINGULAIR Take 10 mg by mouth at bedtime.   multivitamin with minerals Tabs tablet Take 1 tablet by mouth daily.   nabumetone 500 MG tablet Commonly known as: RELAFEN Take 500 mg by mouth daily.   naproxen sodium 220 MG  tablet Commonly known as: ALEVE Take 220 mg by mouth daily as needed.   PARoxetine 25 MG 24 hr tablet Commonly known as: PAXIL-CR Take 25 mg by mouth daily.   penciclovir 1 % cream Commonly known as: DENAVIR Apply 1 application topically every 2 (two) hours as needed.   zolpidem 10 MG tablet Commonly known as: AMBIEN Take 10 mg by mouth at bedtime as needed.        Follow-up Information     Baxter Hire, MD. Schedule an appointment as soon as possible for a visit in 1 week(s).   Specialty: Internal Medicine Why: hospital f/u Contact information: [place] [id number] [phone]                Discharge Exam: [name] Weights   11/24/21 1615  Weight: 99.3 kg     Condition at discharge: fair  The results of significant diagnostics from this hospitalization (including imaging, microbiology, ancillary and laboratory) are listed below for reference.   Imaging Studies: US Carotid Bilateral  Result Date: 11/25/2021 CLINICAL DATA:  Recurrent syncope EXAM: BILATERAL CAROTID DUPLEX ULTRASOUND TECHNIQUE: Pearline Cables scale imaging, color Doppler and duplex ultrasound were performed of bilateral carotid and vertebral arteries in the neck. COMPARISON:  None Available. FINDINGS: Criteria: Quantification of carotid stenosis is based on velocity parameters that correlate the residual internal carotid diameter with NASCET-based stenosis levels, using the diameter of the distal internal carotid lumen as the denominator for stenosis measurement. The following velocity measurements were obtained: RIGHT ICA: 93 cm/sec CCA: 353 cm/sec SYSTOLIC ICA/CCA RATIO:  1.1 ECA: 129 cm/sec LEFT ICA: 80 cm/sec CCA: 83 cm/sec SYSTOLIC ICA/CCA RATIO:  1.0 ECA: 84 cm/sec RIGHT CAROTID ARTERY: No significant narrowing. RIGHT VERTEBRAL ARTERY:  Patent with antegrade flow. LEFT CAROTID ARTERY:  No significant narrowing. LEFT VERTEBRAL ARTERY:  Patent with antegrade flow. IMPRESSION: No  significant stenosis is seen in carotid arteries on both sides. Both vertebral arteries are patent with antegrade flow. Electronically Signed   By: Elmer Picker M.D.   On: 11/25/2021 12:51   CT HEAD WO CONTRAST (5MM)  Result Date: 11/24/2021 CLINICAL DATA:  Head trauma, minor (Age >= 65y). Generalized weakness, headache. EXAM: CT HEAD WITHOUT CONTRAST TECHNIQUE: Contiguous axial images were obtained from the base of the skull through the vertex without intravenous contrast. RADIATION DOSE REDUCTION: This exam was performed according to the departmental dose-optimization program which includes automated exposure control, adjustment of the mA and/or kV according to patient size and/or use of iterative reconstruction technique. COMPARISON:  None Available. FINDINGS: Brain: Right suboccipital craniotomy and mastoidectomy has been performed in keeping with history of a right acoustic neuroma resection. No acute intracranial hemorrhage or infarct. No abnormal mass effect or midline shift. No abnormal intra or extra-axial mass lesion or fluid collection. Ventricular size is normal. Cerebellum is unremarkable.  Vascular: No asymmetric hyperdense vasculature at the skull base. Skull: No acute fracture Sinuses/Orbits: Paranasal sinuses are clear. Visualized orbits are unremarkable. Other: Left mastoid air cells and middle ear cavities are clear. IMPRESSION: 1. No acute intracranial abnormality. No calvarial fracture. 2. Status post right acoustic neuroma resection. Electronically Signed   By: Fidela Salisbury M.D.   On: 11/24/2021 23:46   DG Chest Portable 1 View  Result Date: 11/24/2021 CLINICAL DATA:  Shortness of breath EXAM: PORTABLE CHEST 1 VIEW COMPARISON:  11/17/2021 FINDINGS: Heart and mediastinal contours are within normal limits. No focal opacities or effusions. No acute bony abnormality. IMPRESSION: No active disease. Electronically Signed   By: Rolm Baptise M.D.   On: 11/24/2021 23:07   DG Chest 2  View  Result Date: 11/17/2021 CLINICAL DATA:  Shortness of breath EXAM: CHEST - 2 VIEW COMPARISON:  Chest x-ray dated October 14th 2008 FINDINGS: Cardiac and mediastinal contours are within normal limits. Elevation of the right hemidiaphragm. Mild bibasilar opacities, likely due to atelectasis. No pleural effusion or pneumothorax. The visualized skeletal structures are unremarkable. IMPRESSION: Mild bibasilar opacities, likely due to atelectasis. Electronically Signed   By: Yetta Glassman M.D.   On: 11/17/2021 13:02    Microbiology: Results for orders placed or performed during the hospital encounter of 11/24/21  Gastrointestinal Panel by PCR , Stool     Status: None   Collection Time: 11/25/21 12:07 PM   Specimen: Stool  Result Value Ref Range Status   Campylobacter species NOT DETECTED NOT DETECTED Final   Plesimonas shigelloides NOT DETECTED NOT DETECTED Final   Salmonella species NOT DETECTED NOT DETECTED Final   Yersinia enterocolitica NOT DETECTED NOT DETECTED Final   Vibrio species NOT DETECTED NOT DETECTED Final   Vibrio cholerae NOT DETECTED NOT DETECTED Final   Enteroaggregative E coli (EAEC) NOT DETECTED NOT DETECTED Final   Enteropathogenic E coli (EPEC) NOT DETECTED NOT DETECTED Final   Enterotoxigenic E coli (ETEC) NOT DETECTED NOT DETECTED Final   Shiga like toxin producing E coli (STEC) NOT DETECTED NOT DETECTED Final   Shigella/Enteroinvasive E coli (EIEC) NOT DETECTED NOT DETECTED Final   Cryptosporidium NOT DETECTED NOT DETECTED Final   Cyclospora cayetanensis NOT DETECTED NOT DETECTED Final   Entamoeba histolytica NOT DETECTED NOT DETECTED Final   Giardia lamblia NOT DETECTED NOT DETECTED Final   Adenovirus F40/41 NOT DETECTED NOT DETECTED Final   Astrovirus NOT DETECTED NOT DETECTED Final   Norovirus GI/GII NOT DETECTED NOT DETECTED Final   Rotavirus A NOT DETECTED NOT DETECTED Final   Sapovirus (I, II, IV, and V) NOT DETECTED NOT DETECTED Final    Comment:  Performed at [facility], [address] [place], [place] [address]  C Difficile Quick Screen w PCR reflex     Status: None   Collection Time: 11/25/21 12:07 PM   Specimen: Stool  Result Value Ref Range Status   C Diff antigen NEGATIVE NEGATIVE Final   C Diff toxin NEGATIVE NEGATIVE Final   C Diff interpretation No C. difficile detected.  Final    Comment: Performed at [facility], [address] [place], [place] [address]    Labs: CBC: Recent Labs  Lab 11/24/21 1744 11/25/21 0610  WBC 16.0* 13.9*  HGB 14.0 12.1  HCT 41.9 37.2  MCV 89.1 91.2  PLT 340 601   Basic Metabolic Panel: Recent Labs  Lab 11/24/21 1744 11/25/21 0610 11/27/21 0433  NA 135 139 142  K 3.2* 3.0* 3.5  CL  90* 95* 105  CO2 33* 35* 30  GLUCOSE 231* 206* 174*  BUN 24* 28* 15  CREATININE 0.81 0.76 0.63  CALCIUM 8.9 8.4* 8.4*   Liver Function Tests: No results for input(s): "AST", "ALT", "ALKPHOS", "BILITOT", "PROT", "ALBUMIN" in the last 168 hours. CBG: No results for input(s): "GLUCAP" in the last 168 hours.  Discharge time spent: greater than 30 minutes.  Signed: Fritzi Mandes, MD [facility] 11/27/2021

## 2024-02-12 ENCOUNTER — Other Ambulatory Visit: Payer: Self-pay | Admitting: Internal Medicine

## 2024-02-12 DIAGNOSIS — K760 Fatty (change of) liver, not elsewhere classified: Secondary | ICD-10-CM

## 2024-02-20 ENCOUNTER — Ambulatory Visit
Admission: RE | Admit: 2024-02-20 | Discharge: 2024-02-20 | Disposition: A | Discharge status: Home / Self Care | Source: Ambulatory Visit | Attending: Internal Medicine | Admitting: Internal Medicine

## 2024-02-20 DIAGNOSIS — K760 Fatty (change of) liver, not elsewhere classified: Secondary | ICD-10-CM | POA: Insufficient documentation
